# Patient Record
Sex: Female | Born: 1970 | Race: White | Hispanic: No | Marital: Married | Smoking: Never smoker
Health system: Southern US, Community
[De-identification: ages and names within clinical notes are randomized; demographics above are authoritative.]

## PROBLEM LIST (undated history)

## (undated) DIAGNOSIS — R0602 Shortness of breath: Secondary | ICD-10-CM

## (undated) DIAGNOSIS — K259 Gastric ulcer, unspecified as acute or chronic, without hemorrhage or perforation: Secondary | ICD-10-CM

## (undated) DIAGNOSIS — I10 Essential (primary) hypertension: Secondary | ICD-10-CM

## (undated) DIAGNOSIS — M7660 Achilles tendinitis, unspecified leg: Secondary | ICD-10-CM

## (undated) DIAGNOSIS — K9041 Non-celiac gluten sensitivity: Secondary | ICD-10-CM

## (undated) DIAGNOSIS — IMO0002 Reserved for concepts with insufficient information to code with codable children: Secondary | ICD-10-CM

## (undated) DIAGNOSIS — K219 Gastro-esophageal reflux disease without esophagitis: Secondary | ICD-10-CM

## (undated) DIAGNOSIS — F419 Anxiety disorder, unspecified: Secondary | ICD-10-CM

## (undated) DIAGNOSIS — E785 Hyperlipidemia, unspecified: Secondary | ICD-10-CM

## (undated) DIAGNOSIS — J45909 Unspecified asthma, uncomplicated: Secondary | ICD-10-CM

## (undated) DIAGNOSIS — K760 Fatty (change of) liver, not elsewhere classified: Secondary | ICD-10-CM

## (undated) DIAGNOSIS — J309 Allergic rhinitis, unspecified: Secondary | ICD-10-CM

## (undated) DIAGNOSIS — R5383 Other fatigue: Secondary | ICD-10-CM

## (undated) DIAGNOSIS — F32A Depression, unspecified: Secondary | ICD-10-CM

## (undated) DIAGNOSIS — T7840XA Allergy, unspecified, initial encounter: Secondary | ICD-10-CM

## (undated) DIAGNOSIS — M722 Plantar fascial fibromatosis: Secondary | ICD-10-CM

## (undated) DIAGNOSIS — Z91018 Allergy to other foods: Secondary | ICD-10-CM

## (undated) HISTORY — DX: Gastro-esophageal reflux disease without esophagitis: K21.9

## (undated) HISTORY — DX: Hyperlipidemia, unspecified: E78.5

## (undated) HISTORY — DX: Allergy, unspecified, initial encounter: T78.40XA

## (undated) HISTORY — DX: Unspecified asthma, uncomplicated: J45.909

## (undated) HISTORY — DX: Non-celiac gluten sensitivity: K90.41

## (undated) HISTORY — DX: Allergy to other foods: Z91.018

## (undated) HISTORY — DX: Plantar fascial fibromatosis: M72.2

## (undated) HISTORY — PX: PLANTAR FASCIA RELEASE: SHX2239

## (undated) HISTORY — DX: Reserved for concepts with insufficient information to code with codable children: IMO0002

## (undated) HISTORY — DX: Fatty (change of) liver, not elsewhere classified: K76.0

## (undated) HISTORY — DX: Anxiety disorder, unspecified: F41.9

## (undated) HISTORY — DX: Essential (primary) hypertension: I10

## (undated) HISTORY — DX: Allergic rhinitis, unspecified: J30.9

## (undated) HISTORY — PX: CARPAL TUNNEL RELEASE: SHX101

## (undated) HISTORY — PX: TUBAL LIGATION: SHX77

## (undated) HISTORY — DX: Gastric ulcer, unspecified as acute or chronic, without hemorrhage or perforation: K25.9

## (undated) HISTORY — DX: Achilles tendinitis, unspecified leg: M76.60

## (undated) HISTORY — DX: Depression, unspecified: F32.A

## (undated) HISTORY — DX: Other fatigue: R53.83

## (undated) HISTORY — DX: Shortness of breath: R06.02

---

## 1998-01-16 ENCOUNTER — Encounter: Admission: RE | Admit: 1998-01-16 | Discharge: 1998-01-16 | Payer: Self-pay | Admitting: Infectious Diseases

## 1998-01-30 ENCOUNTER — Encounter: Admission: RE | Admit: 1998-01-30 | Discharge: 1998-01-30 | Payer: Self-pay | Admitting: Infectious Diseases

## 1998-02-06 ENCOUNTER — Encounter: Admission: RE | Admit: 1998-02-06 | Discharge: 1998-02-06 | Payer: Self-pay | Admitting: Infectious Diseases

## 2006-12-17 ENCOUNTER — Ambulatory Visit: Payer: Self-pay | Admitting: Emergency Medicine

## 2010-06-27 HISTORY — PX: OTHER SURGICAL HISTORY: SHX169

## 2011-02-21 DIAGNOSIS — E7800 Pure hypercholesterolemia, unspecified: Secondary | ICD-10-CM | POA: Insufficient documentation

## 2011-02-21 DIAGNOSIS — E669 Obesity, unspecified: Secondary | ICD-10-CM | POA: Insufficient documentation

## 2011-02-21 DIAGNOSIS — F329 Major depressive disorder, single episode, unspecified: Secondary | ICD-10-CM | POA: Insufficient documentation

## 2011-02-21 DIAGNOSIS — G4700 Insomnia, unspecified: Secondary | ICD-10-CM | POA: Insufficient documentation

## 2012-05-07 DIAGNOSIS — L821 Other seborrheic keratosis: Secondary | ICD-10-CM | POA: Insufficient documentation

## 2013-06-07 DIAGNOSIS — N3090 Cystitis, unspecified without hematuria: Secondary | ICD-10-CM | POA: Insufficient documentation

## 2013-11-26 DIAGNOSIS — K589 Irritable bowel syndrome without diarrhea: Secondary | ICD-10-CM | POA: Insufficient documentation

## 2013-11-26 DIAGNOSIS — Z9889 Other specified postprocedural states: Secondary | ICD-10-CM | POA: Insufficient documentation

## 2015-10-07 ENCOUNTER — Other Ambulatory Visit: Payer: Self-pay | Admitting: Family Medicine

## 2015-10-07 DIAGNOSIS — K7460 Unspecified cirrhosis of liver: Secondary | ICD-10-CM

## 2015-10-26 ENCOUNTER — Ambulatory Visit
Admission: RE | Admit: 2015-10-26 | Discharge: 2015-10-26 | Disposition: A | Discharge status: Home / Self Care | Payer: BLUE CROSS/BLUE SHIELD | Source: Ambulatory Visit | Attending: Family Medicine | Admitting: Family Medicine

## 2015-10-26 DIAGNOSIS — E669 Obesity, unspecified: Secondary | ICD-10-CM | POA: Insufficient documentation

## 2015-10-26 DIAGNOSIS — K7460 Unspecified cirrhosis of liver: Secondary | ICD-10-CM

## 2015-10-26 DIAGNOSIS — Z8489 Family history of other specified conditions: Secondary | ICD-10-CM | POA: Insufficient documentation

## 2015-10-26 DIAGNOSIS — Z6841 Body Mass Index (BMI) 40.0 and over, adult: Secondary | ICD-10-CM | POA: Diagnosis present

## 2015-10-26 DIAGNOSIS — R932 Abnormal findings on diagnostic imaging of liver and biliary tract: Secondary | ICD-10-CM | POA: Insufficient documentation

## 2017-05-26 IMAGING — US US ABDOMEN LIMITED
1 series · 14 of 25 positions shown · non-contrast
Comparison: None.

CLINICAL DATA: Cirrhosis without ascites.

EXAM:
US ABDOMEN LIMITED - RIGHT UPPER QUADRANT

[Series 1: us abdomen limited · 0.28mm/px · 14 of 45 slices shown]
[im 1/45]
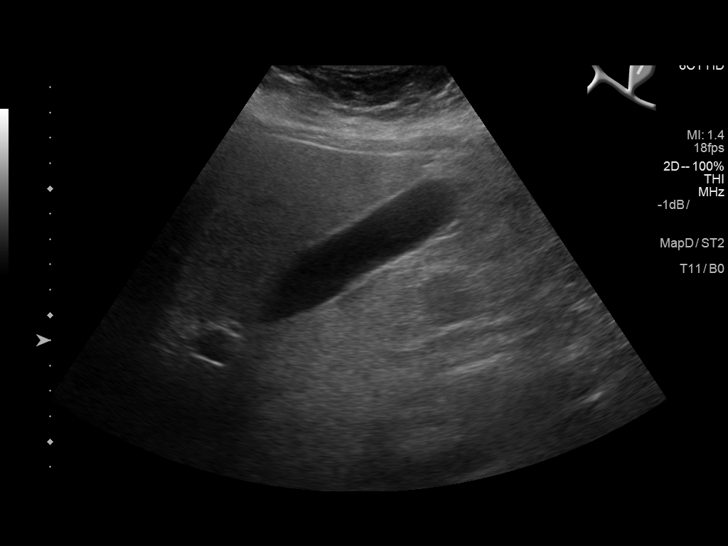
[im 4/45]
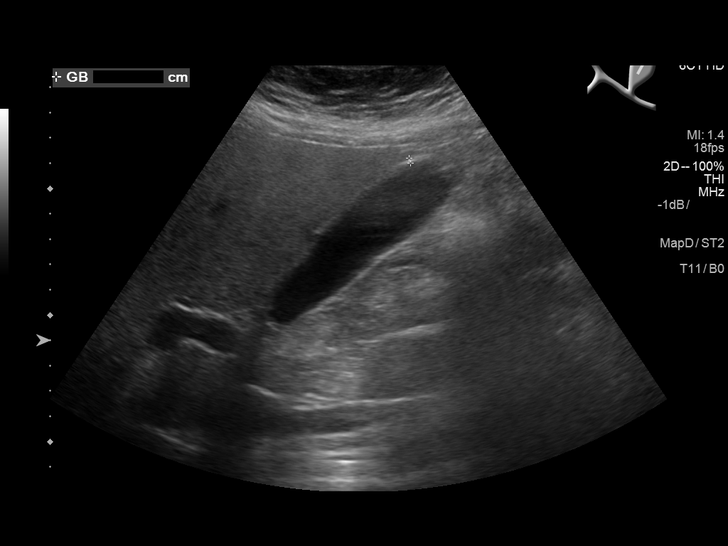
[im 8/45]
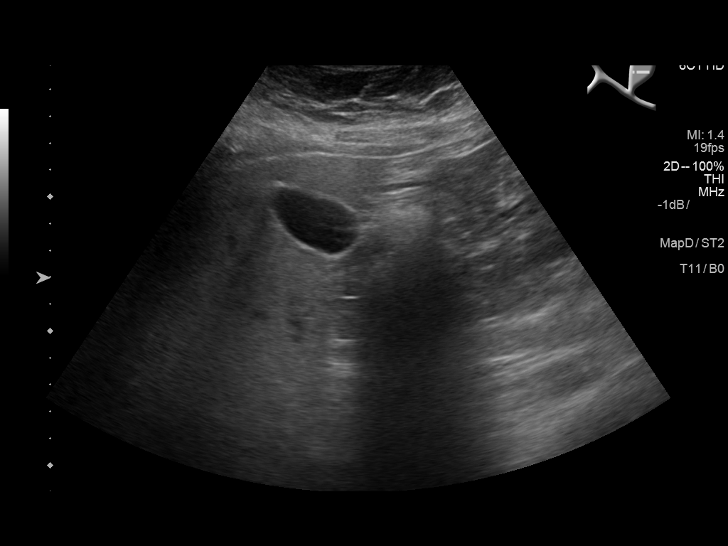
[im 12/45]
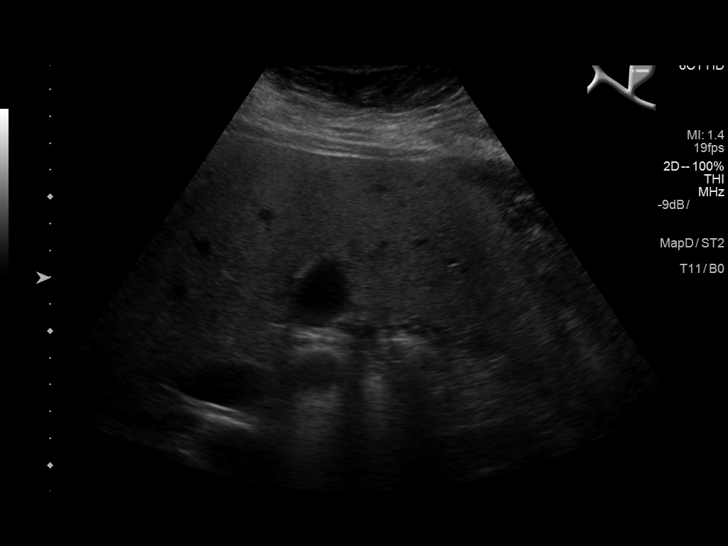
[im 15/45]
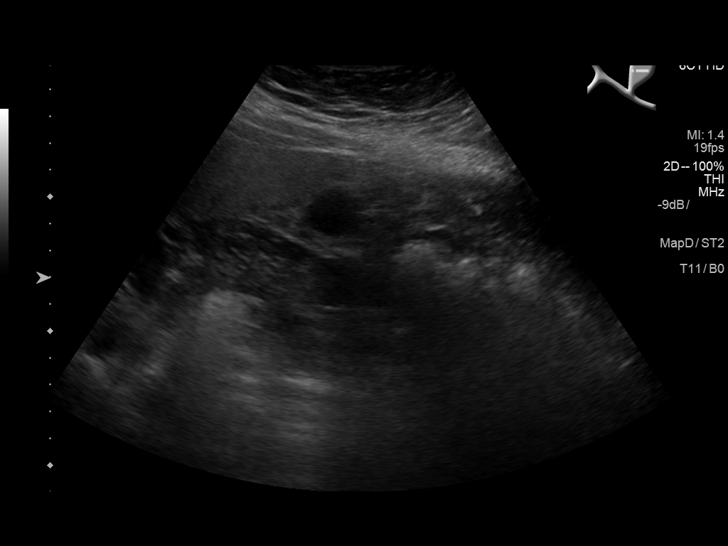
[im 17/45]
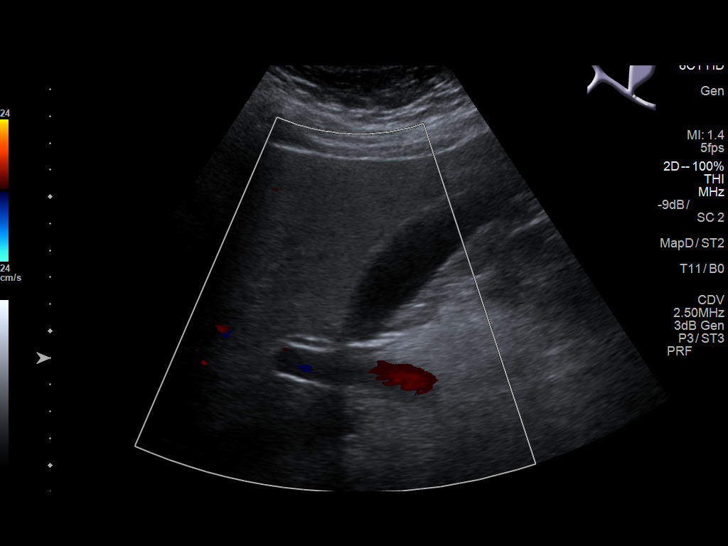
[im 21/45]
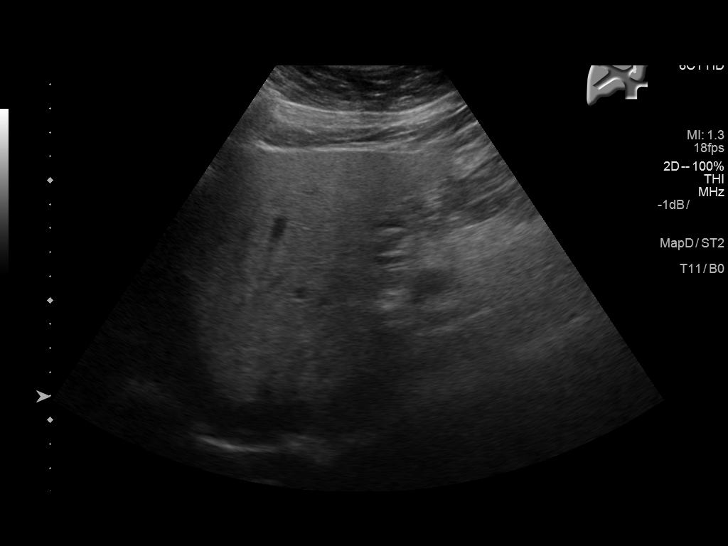
[im 24/45]
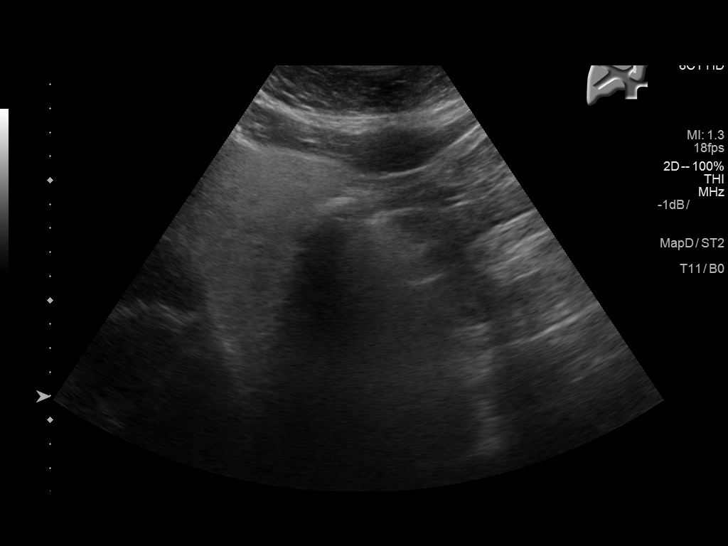
[im 28/45]
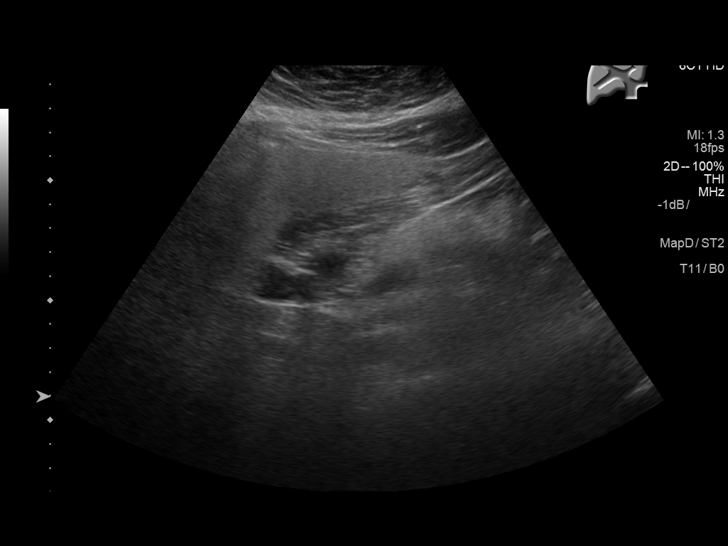
[im 30/45]
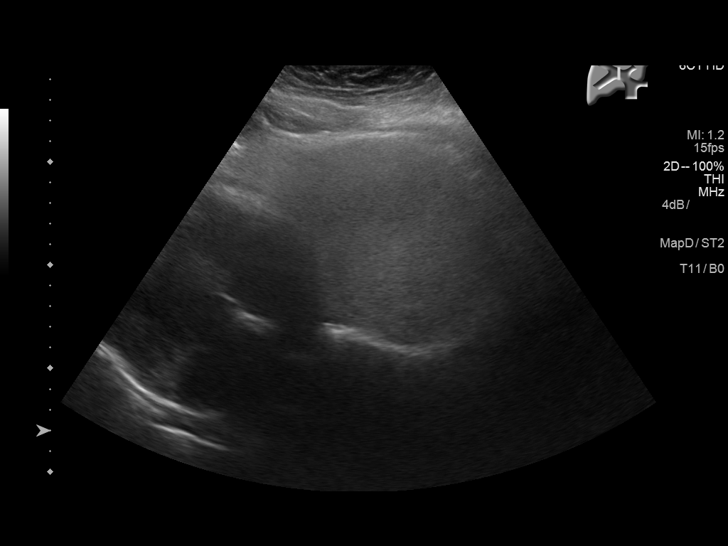
[im 34/45]
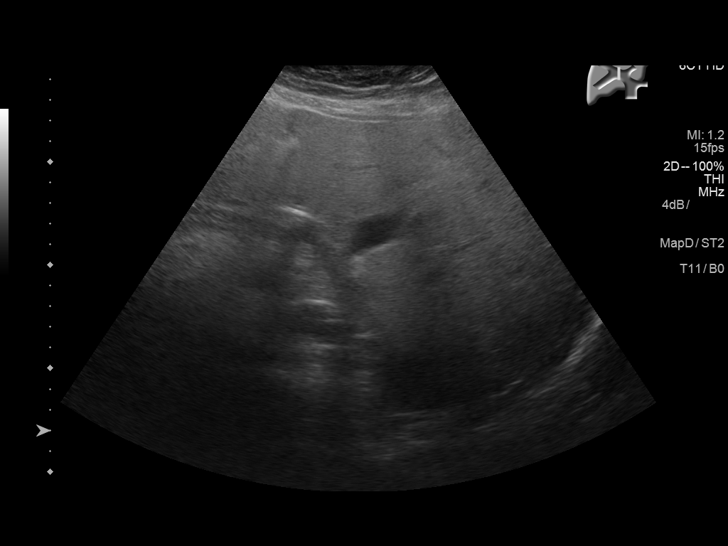
[im 37/45]
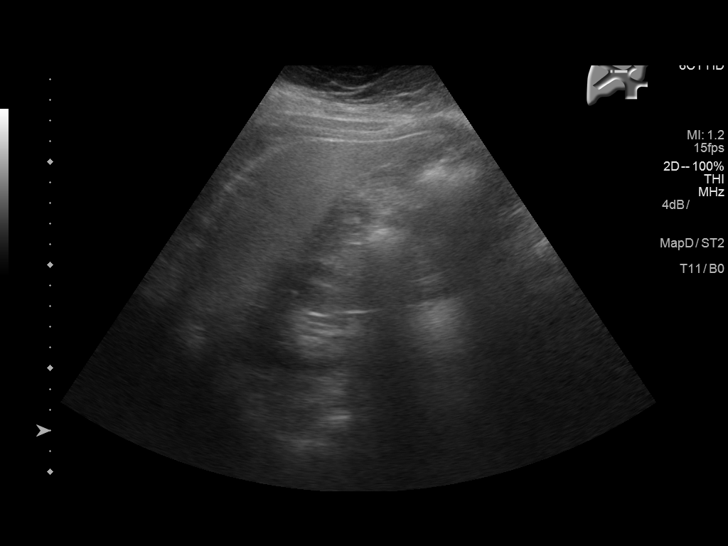
[im 41/45]
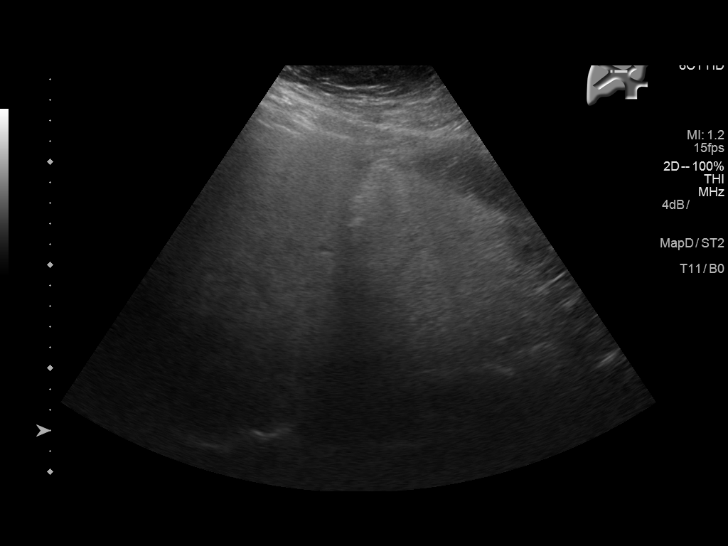
[im 45/45]
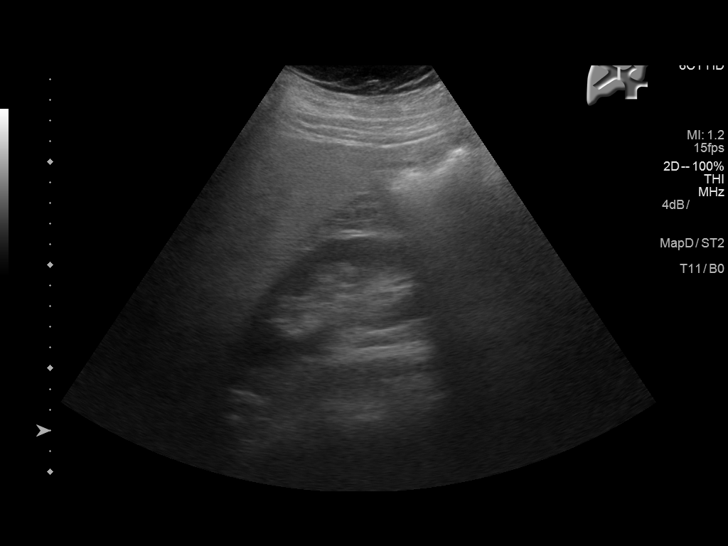

[14 of 25 positions shown; findings below may reference images not displayed]

FINDINGS: Gallbladder:

No gallstones or wall thickening visualized. No sonographic Murphy
sign noted by sonographer.

Common bile duct:

Diameter: Normal at 3 mm

Liver:

Liver is uniformly increased in echogenicity. No focal lesion
identified. No biliary duct dilatation.
IMPRESSION: Echogenic liver consistent given history of cirrhosis. No focal
lesion identified by ultrasound.

## 2018-01-24 DIAGNOSIS — J4550 Severe persistent asthma, uncomplicated: Secondary | ICD-10-CM | POA: Insufficient documentation

## 2018-01-24 DIAGNOSIS — F411 Generalized anxiety disorder: Secondary | ICD-10-CM | POA: Insufficient documentation

## 2018-01-24 DIAGNOSIS — J45901 Unspecified asthma with (acute) exacerbation: Secondary | ICD-10-CM | POA: Insufficient documentation

## 2018-05-09 DIAGNOSIS — R0602 Shortness of breath: Secondary | ICD-10-CM | POA: Insufficient documentation

## 2018-07-03 ENCOUNTER — Ambulatory Visit (INDEPENDENT_AMBULATORY_CARE_PROVIDER_SITE_OTHER): Payer: BLUE CROSS/BLUE SHIELD | Admitting: Allergy and Immunology

## 2018-07-03 ENCOUNTER — Encounter: Payer: Self-pay | Admitting: Allergy and Immunology

## 2018-07-03 VITALS — BP 136/82 | HR 84 | Temp 98.4°F | Resp 20 | Ht 66.0 in | Wt 249.0 lb

## 2018-07-03 DIAGNOSIS — J4540 Moderate persistent asthma, uncomplicated: Secondary | ICD-10-CM | POA: Diagnosis not present

## 2018-07-03 DIAGNOSIS — J3089 Other allergic rhinitis: Secondary | ICD-10-CM | POA: Diagnosis not present

## 2018-07-03 NOTE — Progress Notes (Signed)
Dear Dr. Arlis Porta,  Thank you for referring Shelia Bowers to the [facility] [place] on 07/03/2018.   Below is a summation of this patient's evaluation and recommendations.  Thank you for your referral. I will keep you informed about this patient's response to treatment.   If you have any questions please do not hesitate to contact me.   Sincerely,  Jessica Priest, MD Allergy / Immunology [facility]   ______________________________________________________________________    NEW PATIENT NOTE  Referring Provider: Jerrol Banana, MD Primary Provider: Dortha Kern, MD Date of office visit: 07/03/2018    Subjective:   Chief Complaint:  Shelia Bowers (DOB: [birth year]) is a 49 y.o. female who presents to the clinic on 07/03/2018 with a chief complaint of New Patient (Initial Visit) (transferring care from [facility] has moved here she is on itx) .     HPI: Shelia Bowers presents to this clinic in evaluation of asthma and allergic rhinitis.  She has a long history of issues with asthma and allergic rhinitis and has been receiving immunotherapy directed at dust mite, cat, dog, and pollens, from [facility] [place] with Dr. Arlis Porta over the course of the past 3 years.  Her job has changed and she is now commuting to the [place] area and would like to transfer her immunotherapy to this clinic.  Immunotherapy has resulted in rather dramatic improvement regarding all of her airway issues.  She has not required a systemic steroid in over a year to treat any type of airway issue while continuing to use Advair on a regular basis.  There was an attempt to taper her off Advair and replace this medication with Flovent in the fall 2018 but apparently she failed that medication tapering.  She rarely uses a short acting bronchodilator.  She did have a very slight flareup of her asthma during an  upper respiratory tract infection in September 2019 that required her to use a short acting bronchodilator for a few days.  But otherwise she has had excellent control of all of her airway issue.  Currently immunotherapy is administered every 4 weeks.  She has been receiving this form of treatment without any adverse effect.  She did receive the flu vaccine this year.  Past Medical History:  Diagnosis Date  . Asthma     Past Surgical History:  Procedure Laterality Date  . CARPAL TUNNEL RELEASE Bilateral   . TUBAL LIGATION      Allergies as of 07/03/2018      Reactions   Azithromycin Hives   Clarithromycin Hives   Macrolides And Ketolides Hives   Medroxyprogesterone Other (See Comments)   Mono like sx, fatigue, nausea, vomiting Mono like sx, fatigue, nausea, vomiting Mono like sx, fatigue, nausea, vomiting Mono like sx, fatigue, nausea, vomiting   Rizatriptan Palpitations      Medication List      albuterol 108 (90 Base) MCG/ACT inhaler Commonly known as:  PROVENTIL HFA;VENTOLIN HFA Inhale into the lungs.   Fluticasone-Salmeterol 100-50 MCG/DOSE Aepb Commonly known as:  ADVAIR   multivitamin capsule Take 1 capsule by mouth daily.   PRILOSEC OTC 20 MG tablet Generic drug:  omeprazole Take by mouth.   SAXENDA 18 MG/3ML Sopn Generic drug:  Liraglutide -Weight Management INJECT 3 MG SQ QD   sertraline 50 MG tablet Commonly known as:  ZOLOFT take 1.5 tablet oral daily  Review of systems negative except as noted in HPI / PMHx or noted below:  Review of Systems  Constitutional: Negative.   HENT: Negative.   Eyes: Negative.   Respiratory: Negative.   Cardiovascular: Negative.   Gastrointestinal: Positive for heartburn (Reflux treated with omeprazole daily).  Genitourinary: Negative.   Musculoskeletal: Negative.   Skin: Negative.   Neurological: Negative.   Endo/Heme/Allergies: Negative.   Psychiatric/Behavioral: Negative.     Family History    Problem Relation Age of Onset  . Allergic rhinitis Mother   . Asthma Mother     Social History   Socioeconomic History  . Marital status: Married    Spouse name: Not on file  . Number of children: Not on file  . Years of education: Not on file  . Highest education level: Not on file  Occupational History  . Not on file  Social Needs  . Financial resource strain: Not on file  . Food insecurity:    Worry: Not on file    Inability: Not on file  . Transportation needs:    Medical: Not on file    Non-medical: Not on file  Tobacco Use  . Smoking status: Never Smoker  . Smokeless tobacco: Never Used  Substance and Sexual Activity  . Alcohol use: Never    Frequency: Never  . Drug use: Never  . Sexual activity: Not on file  Lifestyle  . Physical activity:    Days per week: Not on file    Minutes per session: Not on file  . Stress: Not on file  Relationships  . Social connections:    Talks on phone: Not on file    Gets together: Not on file    Attends religious service: Not on file    Active member of club or organization: Not on file    Attends meetings of clubs or organizations: Not on file    Relationship status: Not on file  . Intimate partner violence:    Fear of current or ex partner: Not on file    Emotionally abused: Not on file    Physically abused: Not on file    Forced sexual activity: Not on file  Other Topics Concern  . Not on file  Social History Narrative  . Not on file    Environmental and Social history  Lives in a house with a dry environment, a dog located inside the household, no carpet in the bedroom, plastic on the bed, plastic on the pillow, no smokers located to the household.  She works in an office setting.  Objective:   Vitals:   07/03/18 1339  BP: 136/82  Pulse: 84  Resp: 20  Temp: 98.4 F (36.9 C)  SpO2: 97%   Height: 5\' 6"  (167.6 cm) Weight: 249 lb (112.9 kg)  Physical Exam Constitutional:      Appearance: She is not  diaphoretic.  HENT:     Head: Normocephalic.     Right Ear: Tympanic membrane, ear canal and external ear normal.     Left Ear: Tympanic membrane, ear canal and external ear normal.     Nose: Nose normal. No mucosal edema or rhinorrhea.     Mouth/Throat:     Pharynx: Uvula midline. No oropharyngeal exudate.  Eyes:     Conjunctiva/sclera: Conjunctivae normal.  Neck:     Thyroid: No thyromegaly.     Trachea: Trachea normal. No tracheal tenderness or tracheal deviation.  Cardiovascular:     Rate and Rhythm: Normal rate  and regular rhythm.     Heart sounds: Normal heart sounds, S1 normal and S2 normal. No murmur.  Pulmonary:     Effort: No respiratory distress.     Breath sounds: Normal breath sounds. No stridor. No wheezing or rales.  Lymphadenopathy:     Head:     Right side of head: No tonsillar adenopathy.     Left side of head: No tonsillar adenopathy.     Cervical: No cervical adenopathy.  Skin:    Findings: No erythema or rash.     Nails: There is no clubbing.   Neurological:     Mental Status: She is alert.     Diagnostics: Allergy skin tests were not performed.   Spirometry was performed and demonstrated an FEV1 of 3.07 @ 100 % of predicted. FEV1/FVC = 0.9   Assessment and Plan:    1. Asthma, moderate persistent, well-controlled   2. Other allergic rhinitis     1.  Continue Advair 100 -1 inhalation twice a day  2.  Continue Ventolin HFA 2 and elations every 4-6 hours if needed  3.  Continue OTC Flonase 1-2 sprays each nostril daily during periods of upper airway symptoms  4.  Continue OTC antihistamine if needed  5.  Continue immunotherapy and injectable epinephrine device.  Continue to utilize allergen extract from [facility]  6.  Return to clinic in 1 year or earlier if problem  Shelia Bowers will continue on immunotherapy with extract formulated by [facility] as she has had an excellent response to this form of therapy over the course of the past  several years.  I recommended that she aim for a full 5 years of immunotherapy and then consider discontinuing this form of treatment.  We will be available in this clinic should she develop significant problems in the future.  She will remain on anti-inflammatory medications for her airway as noted above.  She needs to return to this clinic 1 time per year while continuing to receive immunotherapy in this location.  Jessica PriestEric J. Rikki Smestad, MD Allergy / Immunology [facility] [place]

## 2018-07-03 NOTE — Patient Instructions (Addendum)
  1.  Continue Advair 100 -1 inhalation twice a day  2.  Continue Ventolin HFA 2 and elations every 4-6 hours if needed  3.  Continue OTC Flonase 1-2 sprays each nostril daily during periods of upper airway symptoms  4.  Continue OTC antihistamine if needed  5.  Continue immunotherapy and injectable epinephrine device.  Continue to utilize allergen extract from [facility]  6.  Return to clinic in 1 year or earlier if problem

## 2018-07-04 ENCOUNTER — Encounter: Payer: Self-pay | Admitting: Allergy and Immunology

## 2018-07-31 ENCOUNTER — Ambulatory Visit (INDEPENDENT_AMBULATORY_CARE_PROVIDER_SITE_OTHER): Payer: BLUE CROSS/BLUE SHIELD

## 2018-07-31 DIAGNOSIS — J3089 Other allergic rhinitis: Secondary | ICD-10-CM | POA: Diagnosis not present

## 2018-08-30 ENCOUNTER — Ambulatory Visit (INDEPENDENT_AMBULATORY_CARE_PROVIDER_SITE_OTHER): Payer: BLUE CROSS/BLUE SHIELD | Admitting: *Deleted

## 2018-08-30 DIAGNOSIS — J309 Allergic rhinitis, unspecified: Secondary | ICD-10-CM

## 2018-09-27 ENCOUNTER — Ambulatory Visit (INDEPENDENT_AMBULATORY_CARE_PROVIDER_SITE_OTHER): Payer: BLUE CROSS/BLUE SHIELD | Admitting: *Deleted

## 2018-09-27 DIAGNOSIS — J309 Allergic rhinitis, unspecified: Secondary | ICD-10-CM | POA: Diagnosis not present

## 2018-11-01 ENCOUNTER — Ambulatory Visit (INDEPENDENT_AMBULATORY_CARE_PROVIDER_SITE_OTHER): Payer: BLUE CROSS/BLUE SHIELD

## 2018-11-01 DIAGNOSIS — J309 Allergic rhinitis, unspecified: Secondary | ICD-10-CM

## 2018-11-15 ENCOUNTER — Ambulatory Visit (INDEPENDENT_AMBULATORY_CARE_PROVIDER_SITE_OTHER): Payer: BLUE CROSS/BLUE SHIELD | Admitting: *Deleted

## 2018-11-15 DIAGNOSIS — J309 Allergic rhinitis, unspecified: Secondary | ICD-10-CM

## 2018-11-29 ENCOUNTER — Ambulatory Visit (INDEPENDENT_AMBULATORY_CARE_PROVIDER_SITE_OTHER): Payer: BLUE CROSS/BLUE SHIELD

## 2018-11-29 DIAGNOSIS — J309 Allergic rhinitis, unspecified: Secondary | ICD-10-CM

## 2018-12-20 ENCOUNTER — Ambulatory Visit (INDEPENDENT_AMBULATORY_CARE_PROVIDER_SITE_OTHER): Payer: BLUE CROSS/BLUE SHIELD

## 2018-12-20 DIAGNOSIS — J309 Allergic rhinitis, unspecified: Secondary | ICD-10-CM | POA: Diagnosis not present

## 2019-01-17 ENCOUNTER — Ambulatory Visit (INDEPENDENT_AMBULATORY_CARE_PROVIDER_SITE_OTHER): Payer: BLUE CROSS/BLUE SHIELD | Admitting: *Deleted

## 2019-01-17 DIAGNOSIS — J309 Allergic rhinitis, unspecified: Secondary | ICD-10-CM | POA: Diagnosis not present

## 2019-02-14 ENCOUNTER — Ambulatory Visit (INDEPENDENT_AMBULATORY_CARE_PROVIDER_SITE_OTHER): Payer: BLUE CROSS/BLUE SHIELD | Admitting: *Deleted

## 2019-02-14 DIAGNOSIS — J309 Allergic rhinitis, unspecified: Secondary | ICD-10-CM

## 2019-03-14 ENCOUNTER — Ambulatory Visit (INDEPENDENT_AMBULATORY_CARE_PROVIDER_SITE_OTHER): Payer: BLUE CROSS/BLUE SHIELD

## 2019-03-14 DIAGNOSIS — J309 Allergic rhinitis, unspecified: Secondary | ICD-10-CM | POA: Diagnosis not present

## 2019-04-10 ENCOUNTER — Ambulatory Visit (INDEPENDENT_AMBULATORY_CARE_PROVIDER_SITE_OTHER): Payer: BLUE CROSS/BLUE SHIELD

## 2019-04-10 DIAGNOSIS — J309 Allergic rhinitis, unspecified: Secondary | ICD-10-CM | POA: Diagnosis not present

## 2019-04-29 ENCOUNTER — Other Ambulatory Visit: Payer: Self-pay

## 2019-04-29 DIAGNOSIS — Z20822 Contact with and (suspected) exposure to covid-19: Secondary | ICD-10-CM

## 2019-04-30 LAB — NOVEL CORONAVIRUS, NAA: SARS-CoV-2, NAA: DETECTED — AB

## 2019-05-14 ENCOUNTER — Telehealth: Payer: Self-pay | Admitting: *Deleted

## 2019-05-14 ENCOUNTER — Encounter: Payer: Self-pay | Admitting: Allergy and Immunology

## 2019-05-14 NOTE — Telephone Encounter (Signed)
Called and left a voicemail for patient to return call.  

## 2019-05-14 NOTE — Telephone Encounter (Signed)
Shelia Bowers, [name]  You 4 hours ago (10:44 AM)   Can you call this patient and ask her when her symptoms subsided? To be completely safe and if her symptoms have been subsided for 14 days- she could resume therapy next week. Let's call her and see when and what symptoms she had.   Thank you    Message text     Alizandra, Loh, MD 4 hours ago (10:31 AM)     Good morning,  I am recovering from Covid (diagnosed on 11/2) and was due for my allergy injections last Thursday (11/12).  When is is safe for me to resume my therapy?

## 2019-05-14 NOTE — Telephone Encounter (Signed)
Pt informed that she may resume injections next week to be on the safe side- pt was okay with this.

## 2019-05-28 ENCOUNTER — Ambulatory Visit (INDEPENDENT_AMBULATORY_CARE_PROVIDER_SITE_OTHER): Payer: BLUE CROSS/BLUE SHIELD | Admitting: *Deleted

## 2019-05-28 ENCOUNTER — Telehealth: Payer: Self-pay | Admitting: *Deleted

## 2019-05-28 DIAGNOSIS — J309 Allergic rhinitis, unspecified: Secondary | ICD-10-CM | POA: Diagnosis not present

## 2019-05-28 NOTE — Telephone Encounter (Signed)
Shot records and signed vial refill request by patient has been faxed to [facility] requesting new allergen vials and new shot records be mailed to our office. Will need to follow up to ensure documents were received.

## 2019-06-03 NOTE — Telephone Encounter (Signed)
Called and spoke with the shot clinic at [facility] and they confirmed that they received the vial re-order request and they are ordering her vials to be made now and they will be mailed to our office with new shot records.

## 2019-06-26 ENCOUNTER — Ambulatory Visit (INDEPENDENT_AMBULATORY_CARE_PROVIDER_SITE_OTHER): Payer: BLUE CROSS/BLUE SHIELD | Admitting: *Deleted

## 2019-06-26 DIAGNOSIS — J309 Allergic rhinitis, unspecified: Secondary | ICD-10-CM | POA: Diagnosis not present

## 2019-07-31 ENCOUNTER — Ambulatory Visit (INDEPENDENT_AMBULATORY_CARE_PROVIDER_SITE_OTHER): Payer: 59

## 2019-07-31 DIAGNOSIS — J309 Allergic rhinitis, unspecified: Secondary | ICD-10-CM

## 2019-08-30 ENCOUNTER — Ambulatory Visit: Payer: Self-pay | Admitting: Family Medicine

## 2019-09-06 ENCOUNTER — Other Ambulatory Visit: Payer: Self-pay

## 2019-09-06 ENCOUNTER — Telehealth: Payer: Self-pay

## 2019-09-06 ENCOUNTER — Encounter: Payer: Self-pay | Admitting: Family Medicine

## 2019-09-06 ENCOUNTER — Ambulatory Visit (INDEPENDENT_AMBULATORY_CARE_PROVIDER_SITE_OTHER): Payer: 59 | Admitting: Family Medicine

## 2019-09-06 VITALS — BP 130/80 | HR 81 | Temp 98.2°F | Ht 65.0 in | Wt 277.0 lb

## 2019-09-06 DIAGNOSIS — J4540 Moderate persistent asthma, uncomplicated: Secondary | ICD-10-CM

## 2019-09-06 DIAGNOSIS — J302 Other seasonal allergic rhinitis: Secondary | ICD-10-CM

## 2019-09-06 DIAGNOSIS — J3089 Other allergic rhinitis: Secondary | ICD-10-CM

## 2019-09-06 DIAGNOSIS — K219 Gastro-esophageal reflux disease without esophagitis: Secondary | ICD-10-CM | POA: Diagnosis not present

## 2019-09-06 MED ORDER — EPINEPHRINE 0.3 MG/0.3ML IJ SOAJ: 0.3000 mg | Freq: Once | INTRAMUSCULAR | 2 refills | Status: AC

## 2019-09-06 NOTE — Patient Instructions (Addendum)
Asthma Continue Advair 100-1 puff every 12 hours to prevent cough or wheeze Continue albuterol 2 puffs every 4 hours as needed for cough or wheeze You may use albuterol 2 puffs 5-15 minutes before activity to decrease cough or wheeze  Allergic rhinitis Continue allergen immunotherapy and have access to an eoinephrine auto-injector set Audry Riles) Continue Flonase 2 sprays in each nostril once a day as needed for a stuffy nose.  In the right nostril, point the applicator out toward the right ear. In the left nostril, point the applicator out toward the left ear Continue cetirizine 10 mg once a day as needed for a runny nose. Zyrtec D can increase your blood pressure so we recommend an antihistamine without the decongestant component for regular use.   Reflux Continue dietary and lifestyle modifications as listed below Continue opeprazole 20 mg once a day as previously prescribed.  Call the clinic if this treatment plan is not working well for you  Follow up in 6 months or sooner if needed.   Lifestyle Changes for Controlling GERD When you have GERD, stomach acid feels as if it's backing up toward your mouth. Whether or not you take medication to control your GERD, your symptoms can often be improved with lifestyle changes.   Raise Your Head  Reflux is more likely to strike when you're lying down flat, because stomach fluid can  flow backward more easily. Raising the head of your bed 4-6 inches can help. To do this:  Slide blocks or books under the legs at the head of your bed. Or, place a wedge under  the mattress. Many foam stores can make a suitable wedge for you. The wedge  should run from your waist to the top of your head.  Don't just prop your head on several pillows. This increases pressure on your  stomach. It can make GERD worse.  Watch Your Eating Habits Certain foods may increase the acid in your stomach or relax the lower esophageal sphincter, making GERD more likely.  It's best to avoid the following:  Coffee, tea, and carbonated drinks (with and without caffeine)  Fatty, fried, or spicy food  Mint, chocolate, onions, and tomatoes  Any other foods that seem to irritate your stomach or cause you pain  Relieve the Pressure  Eat smaller meals, even if you have to eat more often.  Don't lie down right after you eat. Wait a few hours for your stomach to empty.  Avoid tight belts and tight-fitting clothes.  Lose excess weight.  Tobacco and Alcohol  Avoid smoking tobacco and drinking alcohol. They can make GERD symptoms worse.

## 2019-09-06 NOTE — Progress Notes (Signed)
[address] [place] [address] Dept: [phone]  FOLLOW UP NOTE  Patient ID: Shelia Bowers, female    DOB: 01/22/1971  Age: 49 y.o. MRN: [id number] Date of Office Visit: 09/06/2019  Assessment  Chief Complaint: Asthma and Allergic Rhinitis   HPI Shelia Bowers is a 49 year old female who presents to the clinic for a follow up visit. She was last seen in this clinic on 07/03/2018 by Dr. Lucie Leather for evaluation of asthma, allergic rhinitis, and reflux. At today's visit, she reports her asthma has been moderately well controlled with no shortness of breath or cough.  She does report occasional intermittent wheezing when the pollens are thick and she spent a lot of time outside.  She continues Advair 100-1 puff twice a day and has not used albuterol since the end of November.  Of note, she did have Covid in October.  She reports her wrists taste has returned, however, her sense of smell remains blunted.  Allergic rhinitis is reported as well controlled with her worst symptoms occurring in the spring and the fall.  She is currently using Zyrtec daily and Flonase with relief of symptoms.  She continues allergen immunotherapy once every 4 weeks with no large local reactions.  She reports her symptoms of allergic rhinitis have decreased significantly while continuing on allergen immunotherapy.  Reflux is reported as moderately well controlled with heartburn occurring 1-2 times a week.  She continues Prilosec 20 mg once a day.  Her current medications are listed in the chart.   Drug Allergies:  Allergies  Allergen Reactions  . Azithromycin Hives  . Clarithromycin Hives  . Macrolides And Ketolides Hives  . Medroxyprogesterone Other (See Comments)    Mono like sx, fatigue, nausea, vomiting Mono like sx, fatigue, nausea, vomiting Mono like sx, fatigue, nausea, vomiting Mono like sx, fatigue, nausea, vomiting   . Rizatriptan Palpitations    Physical Exam: BP 130/80 (BP Location:  Right Arm, Patient Position: Sitting, Cuff Size: Large)   Pulse 81   Temp 98.2 F (36.8 C) (Temporal)   Ht 5\' 5"  (1.651 m)   Wt 277 lb (125.6 kg)   SpO2 99%   BMI 46.10 kg/m    Physical Exam Vitals reviewed.  Constitutional:      Appearance: Normal appearance.  HENT:     Head: Normocephalic and atraumatic.     Right Ear: Tympanic membrane normal.     Left Ear: Tympanic membrane normal.     Nose:     Comments: Bilateral nares normal.  Pharynx normal.  Ears normal.  Eyes normal.    Mouth/Throat:     Pharynx: Oropharynx is clear.  Eyes:     Conjunctiva/sclera: Conjunctivae normal.  Cardiovascular:     Rate and Rhythm: Normal rate and regular rhythm.     Heart sounds: Normal heart sounds. No murmur.  Pulmonary:     Effort: Pulmonary effort is normal.     Breath sounds: Normal breath sounds.     Comments: Lungs clear to auscultation Musculoskeletal:        General: Normal range of motion.     Cervical back: Normal range of motion and neck supple.  Skin:    General: Skin is warm and dry.  Neurological:     Mental Status: She is alert and oriented to person, place, and time.  Psychiatric:        Mood and Affect: Mood normal.        Behavior: Behavior normal.  Thought Content: Thought content normal.        Judgment: Judgment normal.     Diagnostics: FVC 3.36, FEV1 2.89.  Predicted FVC 3.68, predicted FEV1 2.92.  Spirometry indicates normal ventilatory function.  Assessment and Plan: 1. Asthma, moderate persistent, well-controlled   2. Seasonal and perennial allergic rhinitis   3. Gastroesophageal reflux disease, unspecified whether esophagitis present     Meds ordered this encounter  Medications  . EPINEPHrine (AUVI-Q) 0.3 mg/0.3 mL IJ SOAJ injection    Sig: Inject 0.3 mLs (0.3 mg total) into the muscle once for 1 dose. As directed for life-threatening allergic reactions    Dispense:  2 each    Refill:  2    [phone]    Patient Instructions   Asthma Continue Advair 100-1 puff every 12 hours to prevent cough or wheeze Continue albuterol 2 puffs every 4 hours as needed for cough or wheeze You may use albuterol 2 puffs 5-15 minutes before activity to decrease cough or wheeze  Allergic rhinitis Continue allergen immunotherapy and have access to an eoinephrine auto-injector set Wynona Luna) Continue Flonase 2 sprays in each nostril once a day as needed for a stuffy nose.  In the right nostril, point the applicator out toward the right ear. In the left nostril, point the applicator out toward the left ear Continue cetirizine 10 mg once a day as needed for a runny nose. Zyrtec D can increase your blood pressure so we recommend an antihistamine without the decongestant component for regular use.   Reflux Continue dietary and lifestyle modifications as listed below Continue opeprazole 20 mg once a day as previously prescribed.  Call the clinic if this treatment plan is not working well for you  Follow up in 6 months or sooner if needed.   Return in about 6 months (around 03/08/2020), or if symptoms worsen or fail to improve.    Thank you for the opportunity to care for this patient.  Please do not hesitate to contact me with questions.  Gareth Morgan, FNP [facility]

## 2019-09-06 NOTE — Telephone Encounter (Signed)
Patient is over 35 days late for her injection. She last received 0.2cc on 07-31-19. I called [facility] in [place] and the office is closed on Fridays. We need to reach out on Monday and get instructions on how to proceed with next dosing.

## 2019-09-09 NOTE — Telephone Encounter (Signed)
Per Eunice Blase at [facility] patient is okay to receive repeat dose of 0.2cc for her injection. Adding note in flowsheet.

## 2019-09-09 NOTE — Telephone Encounter (Signed)
Patient informed of this information.

## 2019-09-24 ENCOUNTER — Ambulatory Visit (INDEPENDENT_AMBULATORY_CARE_PROVIDER_SITE_OTHER): Payer: 59

## 2019-09-24 DIAGNOSIS — J309 Allergic rhinitis, unspecified: Secondary | ICD-10-CM | POA: Diagnosis not present

## 2019-10-31 ENCOUNTER — Ambulatory Visit (INDEPENDENT_AMBULATORY_CARE_PROVIDER_SITE_OTHER): Payer: 59

## 2019-10-31 DIAGNOSIS — J309 Allergic rhinitis, unspecified: Secondary | ICD-10-CM | POA: Diagnosis not present

## 2019-11-28 ENCOUNTER — Ambulatory Visit (INDEPENDENT_AMBULATORY_CARE_PROVIDER_SITE_OTHER): Payer: 59

## 2019-11-28 DIAGNOSIS — J309 Allergic rhinitis, unspecified: Secondary | ICD-10-CM | POA: Diagnosis not present

## 2019-12-26 ENCOUNTER — Ambulatory Visit (INDEPENDENT_AMBULATORY_CARE_PROVIDER_SITE_OTHER): Payer: 59

## 2019-12-26 DIAGNOSIS — J309 Allergic rhinitis, unspecified: Secondary | ICD-10-CM | POA: Diagnosis not present

## 2020-01-21 ENCOUNTER — Ambulatory Visit (INDEPENDENT_AMBULATORY_CARE_PROVIDER_SITE_OTHER): Payer: 59

## 2020-01-21 DIAGNOSIS — J309 Allergic rhinitis, unspecified: Secondary | ICD-10-CM

## 2020-02-20 ENCOUNTER — Ambulatory Visit (INDEPENDENT_AMBULATORY_CARE_PROVIDER_SITE_OTHER): Payer: 59 | Admitting: *Deleted

## 2020-02-20 DIAGNOSIS — J309 Allergic rhinitis, unspecified: Secondary | ICD-10-CM

## 2020-03-10 ENCOUNTER — Encounter: Payer: Self-pay | Admitting: Allergy and Immunology

## 2020-03-10 ENCOUNTER — Ambulatory Visit (INDEPENDENT_AMBULATORY_CARE_PROVIDER_SITE_OTHER): Payer: 59 | Admitting: Allergy and Immunology

## 2020-03-10 ENCOUNTER — Telehealth: Payer: Self-pay | Admitting: *Deleted

## 2020-03-10 ENCOUNTER — Other Ambulatory Visit: Payer: Self-pay

## 2020-03-10 VITALS — BP 110/62 | HR 68 | Temp 98.1°F | Resp 16

## 2020-03-10 DIAGNOSIS — J4540 Moderate persistent asthma, uncomplicated: Secondary | ICD-10-CM

## 2020-03-10 DIAGNOSIS — J309 Allergic rhinitis, unspecified: Secondary | ICD-10-CM

## 2020-03-10 DIAGNOSIS — J3089 Other allergic rhinitis: Secondary | ICD-10-CM

## 2020-03-10 DIAGNOSIS — J302 Other seasonal allergic rhinitis: Secondary | ICD-10-CM

## 2020-03-10 NOTE — Progress Notes (Signed)
[place] - [place] - [place]   Follow-up Note  Referring Provider: Dortha Kern, MD Primary Provider: Dortha Kern, MD Date of Office Visit: 03/10/2020  Subjective:   Shelia Bowers (DOB: June 05, 1971) is a 49 y.o. female who [facility] in re-evaluation of the following:  HPI: Shelia Bowers returns to this clinic in evaluation of allergic rhinoconjunctivitis and asthma.  I have not seen her in this clinic since January 2020 and she did visit with our nurse practitioner in early spring 2021.  She continues to receive immunotherapy utilizing allergy extract from [facility] with a frequency of every 4 weeks without any adverse effect.  This form of therapy has really resulted in very good control of her atopic respiratory disease and she no longer develops a very significant fall time exacerbation of this condition.  Her asthma is under excellent control while using Advair just 1 time per day.  She has not required a systemic steroid in the past year to treat any type of asthma activity and she rarely uses a short acting bronchodilator.  Her nose is doing very well while intermittently and rarely using some nasal steroid.  She has not required the use of an antibiotic to treat any type of upper airway issue.  She contracted Covid in October 2020 manifested as upper respiratory tract infection and GI symptomatology and has received 2 Covid vaccinations.  Allergies as of 03/10/2020      Reactions   Azithromycin Hives   Clarithromycin Hives   Macrolides And Ketolides Hives   Medroxyprogesterone Other (See Comments)   Mono like sx, fatigue, nausea, vomiting Mono like sx, fatigue, nausea, vomiting Mono like sx, fatigue, nausea, vomiting Mono like sx, fatigue, nausea, vomiting   Rizatriptan Palpitations      Medication List      albuterol 108 (90 Base) MCG/ACT inhaler Commonly known as: VENTOLIN HFA Inhale into  the lungs.   Fluticasone-Salmeterol 100-50 MCG/DOSE Aepb Commonly known as: ADVAIR   multivitamin capsule Take 1 capsule by mouth daily.   PriLOSEC OTC 20 MG tablet Generic drug: omeprazole Take by mouth.   Saxenda 18 MG/3ML Sopn Generic drug: Liraglutide -Weight Management INJECT 3 MG SQ QD   sertraline 50 MG tablet Commonly known as: ZOLOFT take 1.5 tablet oral daily       Past Medical History:  Diagnosis Date  . Asthma     Past Surgical History:  Procedure Laterality Date  . CARPAL TUNNEL RELEASE Bilateral   . TUBAL LIGATION      Review of systems negative except as noted in HPI / PMHx or noted below:  Review of Systems  Constitutional: Negative.   HENT: Negative.   Eyes: Negative.   Respiratory: Negative.   Cardiovascular: Negative.   Gastrointestinal: Negative.   Genitourinary: Negative.   Musculoskeletal: Negative.   Skin: Negative.   Neurological: Negative.   Endo/Heme/Allergies: Negative.   Psychiatric/Behavioral: Negative.      Objective:   Vitals:   03/10/20 1635  BP: 110/62  Pulse: 68  Resp: 16  Temp: 98.1 F (36.7 C)  SpO2: 97%          Physical Exam Constitutional:      Appearance: She is not diaphoretic.  HENT:     Head: Normocephalic.     Right Ear: Tympanic membrane, ear canal and external ear normal.     Left Ear: Tympanic membrane, ear canal and external ear normal.  Nose: Nose normal. No mucosal edema or rhinorrhea.     Mouth/Throat:     Pharynx: Uvula midline. No oropharyngeal exudate.  Eyes:     Conjunctiva/sclera: Conjunctivae normal.  Neck:     Thyroid: No thyromegaly.     Trachea: Trachea normal. No tracheal tenderness or tracheal deviation.  Cardiovascular:     Rate and Rhythm: Normal rate and regular rhythm.     Heart sounds: Normal heart sounds, S1 normal and S2 normal. No murmur heard.   Pulmonary:     Effort: No respiratory distress.     Breath sounds: Normal breath sounds. No stridor. No wheezing  or rales.  Lymphadenopathy:     Head:     Right side of head: No tonsillar adenopathy.     Left side of head: No tonsillar adenopathy.     Cervical: No cervical adenopathy.  Skin:    Findings: No erythema or rash.     Nails: There is no clubbing.  Neurological:     Mental Status: She is alert.     Diagnostics:    Spirometry was performed and demonstrated an FEV1 of 2.94 at 101 % of predicted.  Assessment and Plan:   1. Asthma, moderate persistent, well-controlled   2. Seasonal and perennial allergic rhinitis     1.  Continue Advair 100 -1 inhalation 1-2 times per day depending on disease activity  2.  Continue Ventolin HFA 2 and elations every 4-6 hours if needed  3.  Continue OTC Flonase 1-2 sprays each nostril daily during periods of upper airway symptoms  4.  Continue OTC antihistamine if needed  5.  Continue immunotherapy and injectable epinephrine device.  Continue to utilize allergen extract from [facility]  6.  Return to clinic in 1 year or earlier if problem   7.  Obtain fall flu vaccine  Shelia Bowers is doing very well on her current therapy as noted above.  She has undergone approximately 5 years of immunotherapy I did discuss with her the fact that after 5 years of immunotherapy there is an 80% chance that she will be a long-term responder after discontinuing this form of treatment and a 20% chance that she will relapse after discontinuing this form of therapy and she is presently considering her options.  Assuming she does well I will see her back in his clinic in 1 year or earlier if there is a problem.  Laurette Schimke, MD Allergy / Immunology [facility]

## 2020-03-10 NOTE — Telephone Encounter (Signed)
Refill form for the patient's serum has been signed and faxed along with her current shot records to [facility] to re-order her allergy vials.

## 2020-03-10 NOTE — Patient Instructions (Addendum)
  1.  Continue Advair 100 -1 inhalation 1-2 times per day depending on disease activity  2.  Continue Ventolin HFA 2 and elations every 4-6 hours if needed  3.  Continue OTC Flonase 1-2 sprays each nostril daily during periods of upper airway symptoms  4.  Continue OTC antihistamine if needed  5.  Continue immunotherapy and injectable epinephrine device.  Continue to utilize allergen extract from [facility]  6.  Return to clinic in 1 year or earlier if problem   7.  Obtain fall flu vaccine

## 2020-03-11 ENCOUNTER — Encounter: Payer: Self-pay | Admitting: Allergy and Immunology

## 2020-03-11 MED ORDER — FLUTICASONE PROPIONATE 50 MCG/ACT NA SUSP: NASAL | 11 refills | Status: DC

## 2020-04-08 ENCOUNTER — Ambulatory Visit (INDEPENDENT_AMBULATORY_CARE_PROVIDER_SITE_OTHER): Payer: 59 | Admitting: *Deleted

## 2020-04-08 ENCOUNTER — Telehealth: Payer: Self-pay | Admitting: *Deleted

## 2020-04-08 DIAGNOSIS — J309 Allergic rhinitis, unspecified: Secondary | ICD-10-CM

## 2020-04-08 NOTE — Telephone Encounter (Signed)
Tried calling [facility] [phone] (fax # [phone]) for verification on receiving request fax form for new vials. Refer to tel enc 9/14, it was signed by pt and faxed by Shelton Silvas. I gave pt her allergy injections today and I noticed they were low, she can get 1 (maybe 2) more injections out of them. I left a message with [facility] to return my call.

## 2020-04-09 NOTE — Telephone Encounter (Signed)
Spoke with Rosa at [facility] in the shot room and she said they did have the request for 2 new vials and that it was still in que and there orders are 4-6 weeks behind. She said that her new vials should be there next Tuesday and they are currently only doing shipments 1 day a week now, so it its not next Tuesday then it will be the following week. I informed the pt.

## 2020-05-06 ENCOUNTER — Ambulatory Visit (INDEPENDENT_AMBULATORY_CARE_PROVIDER_SITE_OTHER): Payer: 59 | Admitting: *Deleted

## 2020-05-06 DIAGNOSIS — J309 Allergic rhinitis, unspecified: Secondary | ICD-10-CM | POA: Diagnosis not present

## 2020-06-04 ENCOUNTER — Ambulatory Visit (INDEPENDENT_AMBULATORY_CARE_PROVIDER_SITE_OTHER): Payer: 59 | Admitting: *Deleted

## 2020-06-04 DIAGNOSIS — J309 Allergic rhinitis, unspecified: Secondary | ICD-10-CM | POA: Diagnosis not present

## 2020-06-06 LAB — HM PAP SMEAR: HPV Aptima: NEGATIVE

## 2020-06-30 ENCOUNTER — Ambulatory Visit (INDEPENDENT_AMBULATORY_CARE_PROVIDER_SITE_OTHER): Payer: 59

## 2020-06-30 DIAGNOSIS — J309 Allergic rhinitis, unspecified: Secondary | ICD-10-CM | POA: Diagnosis not present

## 2020-07-06 ENCOUNTER — Other Ambulatory Visit: Payer: 59

## 2020-07-06 DIAGNOSIS — Z20822 Contact with and (suspected) exposure to covid-19: Secondary | ICD-10-CM

## 2020-07-07 LAB — NOVEL CORONAVIRUS, NAA: SARS-CoV-2, NAA: NOT DETECTED

## 2020-07-07 LAB — SARS-COV-2, NAA 2 DAY TAT

## 2020-07-07 LAB — SPECIMEN STATUS REPORT

## 2020-07-08 ENCOUNTER — Ambulatory Visit: Payer: 59 | Admitting: Podiatry

## 2020-07-20 ENCOUNTER — Other Ambulatory Visit: Payer: Self-pay

## 2020-07-20 ENCOUNTER — Ambulatory Visit (INDEPENDENT_AMBULATORY_CARE_PROVIDER_SITE_OTHER): Payer: 59

## 2020-07-20 ENCOUNTER — Ambulatory Visit (INDEPENDENT_AMBULATORY_CARE_PROVIDER_SITE_OTHER): Payer: 59 | Admitting: Podiatry

## 2020-07-20 DIAGNOSIS — M722 Plantar fascial fibromatosis: Secondary | ICD-10-CM

## 2020-07-20 DIAGNOSIS — M216X2 Other acquired deformities of left foot: Secondary | ICD-10-CM

## 2020-07-20 DIAGNOSIS — M216X1 Other acquired deformities of right foot: Secondary | ICD-10-CM | POA: Diagnosis not present

## 2020-07-20 MED ORDER — MELOXICAM 15 MG PO TABS: 15.0000 mg | ORAL_TABLET | Freq: Every day | ORAL | 3 refills | Status: DC

## 2020-07-20 MED ORDER — DEXAMETHASONE SODIUM PHOSPHATE 4 MG/ML IJ SOLN
4.0000 mg | Freq: Once | INTRAMUSCULAR | Status: AC
  Administered 2020-07-20: 4 mg

## 2020-07-20 MED ORDER — TRIAMCINOLONE ACETONIDE 40 MG/ML IJ SUSP
10.0000 mg | Freq: Once | INTRAMUSCULAR | Status: AC
  Administered 2020-07-20: 10 mg

## 2020-07-20 NOTE — Progress Notes (Signed)
  Subjective:  Patient ID: Shelia Bowers, female    DOB: 09/22/1970,  MRN: [id number]  Chief Complaint  Patient presents with  . Plantar Fasciitis    Right and left heel pain. Right heel and foot has sharp, shooting pain. Has tried a night splint, wears supportive shoes and ice the area.     50 y.o. female presents with the above complaint. History confirmed with patient.  Been going on since the summer.  It is worse when walking.  The left foot only hurts when she is walking longer distances and toward the end of the day.  The right is constant.  Objective:  Physical Exam: warm, good capillary refill, no trophic changes or ulcerative lesions, normal DP and PT pulses and normal sensory exam. Left Foot: point tenderness over the heel pad  Right Foot: point tenderness over the heel pad, sharp and more severe than the left side  Radiographs: X-ray of the left foot: Pes cavus foot type with no plantar calcaneal enthesophytes noted Assessment:   1. Plantar fasciitis   2. Acquired bilateral pes cavus      Plan:  Patient was evaluated and treated and all questions answered.   Discussed the etiology and treatment options for plantar fasciitis including stretching, formal physical therapy, supportive shoegears such as a running shoe or sneaker, pre fabricated orthoses, injection therapy, and oral medications. We also discussed the role of surgical treatment of this for patients who do not improve after exhausting non-surgical treatment options.   Plantar Fasciitis -XR reviewed with patient -Educated patient on stretching and icing of the affected limb -Injection delivered to the plantar fascia of the right foot. -Rx for meloxicam. Educated on use, risks and benefits of the medication  -Considering her pes cavus foot type, I think she would likely benefit more from custom molded orthoses.  Should be scheduled correct to be casted for these  After sterile prep with povidone-iodine  solution and alcohol, the right heel was injected with . 1 cc 0.5% marcaine plain, 10 mg triamcinolone acetonide, and 4 mg dexamethasone was injected along  the plantar fascia at the insertion on the plantar calcaneus. The patient tolerated the procedure well without complication.    Return in about 1 month (around 08/20/2020).

## 2020-07-20 NOTE — Patient Instructions (Signed)

## 2020-07-22 ENCOUNTER — Ambulatory Visit: Payer: 59 | Admitting: Podiatry

## 2020-07-31 ENCOUNTER — Ambulatory Visit (INDEPENDENT_AMBULATORY_CARE_PROVIDER_SITE_OTHER): Payer: 59 | Admitting: *Deleted

## 2020-07-31 DIAGNOSIS — J309 Allergic rhinitis, unspecified: Secondary | ICD-10-CM

## 2020-08-12 ENCOUNTER — Other Ambulatory Visit: Payer: Self-pay

## 2020-08-12 ENCOUNTER — Ambulatory Visit (INDEPENDENT_AMBULATORY_CARE_PROVIDER_SITE_OTHER): Payer: 59 | Admitting: Orthotics

## 2020-08-12 DIAGNOSIS — M216X2 Other acquired deformities of left foot: Secondary | ICD-10-CM

## 2020-08-12 DIAGNOSIS — M216X1 Other acquired deformities of right foot: Secondary | ICD-10-CM

## 2020-08-12 DIAGNOSIS — M722 Plantar fascial fibromatosis: Secondary | ICD-10-CM

## 2020-08-12 NOTE — Progress Notes (Signed)
Patient came in today for evaluation/assessment custom foot orthotics.  Patient presents foot pain and discomfort associated with plantar fasciitis.  Patient has noted pes cavus foot type with also rear foot varus deformity. Marland KitchenMarland KitchenMarland KitchenGoal is to "bring ground up" with contoured arch support, and 4 degree valgus RF and FF posting.     Also addressing p/f.

## 2020-08-17 ENCOUNTER — Ambulatory Visit (INDEPENDENT_AMBULATORY_CARE_PROVIDER_SITE_OTHER): Payer: 59 | Admitting: Podiatry

## 2020-08-17 ENCOUNTER — Other Ambulatory Visit: Payer: Self-pay

## 2020-08-17 ENCOUNTER — Encounter: Payer: Self-pay | Admitting: Podiatry

## 2020-08-17 DIAGNOSIS — M216X1 Other acquired deformities of right foot: Secondary | ICD-10-CM | POA: Diagnosis not present

## 2020-08-17 DIAGNOSIS — M722 Plantar fascial fibromatosis: Secondary | ICD-10-CM | POA: Diagnosis not present

## 2020-08-17 DIAGNOSIS — M216X2 Other acquired deformities of left foot: Secondary | ICD-10-CM

## 2020-08-17 NOTE — Progress Notes (Signed)
  Subjective:  Patient ID: Shelia Bowers, female    DOB: Jan 27, 1971,  MRN: [id number]  Chief Complaint  Patient presents with  . Plantar Fasciitis    "the left foot is better, but the right foot is still angry and painful"    50 y.o. female returns with the above complaint. History confirmed with patient.  Injection helped for about a day and then went away.  She was casted for orthotics by her pedorthist  Objective:  Physical Exam: warm, good capillary refill, no trophic changes or ulcerative lesions, normal DP and PT pulses and normal sensory exam. Left Foot: Today she has no point tenderness over the heel pad  Right Foot: point tenderness over the heel pad, sharp and more severe than the left side  Radiographs: X-ray of the left foot: Pes cavus foot type with no plantar calcaneal enthesophytes noted Assessment:   No diagnosis found.   Plan:  Patient was evaluated and treated and all questions answered.   Discussed the etiology and treatment options for plantar fasciitis including stretching, formal physical therapy, supportive shoegears such as a running shoe or sneaker, pre fabricated orthoses, injection therapy, and oral medications. We also discussed the role of surgical treatment of this for patients who do not improve after exhausting non-surgical treatment options.  -Continue meloxicam -Continue twice daily stretching -I think should benefit from physical therapy at this point.  Referral for [facility] physical therapy was given to her -Too soon for injection.  I think it would be reasonable to retry another one at next visit if she is not improving -I would like to increase her immobilization with a CAM boot short.  Think this would alleviate her pain and give her some rest.  Expect to be in the boot for 3 to 4 weeks -At next visit if not improving or is worse consider MRI   Return in about 1 month (around 09/14/2020) for recheck plantar fasciitis.

## 2020-08-17 NOTE — Patient Instructions (Signed)
Continue the stretching and using the meloxicam daily  Wear the boot for longer amounts of standing and walking. Do not drive in the boot   If you notice back/hip pain from wearing the boot:  Look for an "EvenUp" shoe attachment on [employer] or at [employer]. This will level out your hips while you are walking in the CAM boot. Wear this on the other foot around a supportive sneaker:

## 2020-08-25 ENCOUNTER — Ambulatory Visit (INDEPENDENT_AMBULATORY_CARE_PROVIDER_SITE_OTHER): Payer: 59

## 2020-08-25 DIAGNOSIS — J309 Allergic rhinitis, unspecified: Secondary | ICD-10-CM

## 2020-08-26 ENCOUNTER — Ambulatory Visit: Payer: 59 | Admitting: Podiatry

## 2020-09-09 ENCOUNTER — Ambulatory Visit (INDEPENDENT_AMBULATORY_CARE_PROVIDER_SITE_OTHER): Payer: 59 | Admitting: Podiatry

## 2020-09-09 ENCOUNTER — Other Ambulatory Visit: Payer: Self-pay

## 2020-09-09 DIAGNOSIS — M216X2 Other acquired deformities of left foot: Secondary | ICD-10-CM

## 2020-09-09 DIAGNOSIS — M216X1 Other acquired deformities of right foot: Secondary | ICD-10-CM

## 2020-09-09 DIAGNOSIS — M722 Plantar fascial fibromatosis: Secondary | ICD-10-CM

## 2020-09-09 NOTE — Progress Notes (Signed)
Patient presents today for orthotic pick up. Patient voices no new complaints.  Orthotics were fitted to patient's feet. No discomfort and no rubbing.  Orthotics were dispensed to patient with instructions for break in wear and to call the office with any concerns or questions.

## 2020-09-09 NOTE — Patient Instructions (Signed)

## 2020-09-14 ENCOUNTER — Other Ambulatory Visit: Payer: Self-pay

## 2020-09-14 ENCOUNTER — Encounter: Payer: Self-pay | Admitting: Podiatry

## 2020-09-14 ENCOUNTER — Ambulatory Visit (INDEPENDENT_AMBULATORY_CARE_PROVIDER_SITE_OTHER): Payer: 59 | Admitting: Podiatry

## 2020-09-14 DIAGNOSIS — M216X1 Other acquired deformities of right foot: Secondary | ICD-10-CM

## 2020-09-14 DIAGNOSIS — M216X2 Other acquired deformities of left foot: Secondary | ICD-10-CM | POA: Diagnosis not present

## 2020-09-14 DIAGNOSIS — M722 Plantar fascial fibromatosis: Secondary | ICD-10-CM | POA: Diagnosis not present

## 2020-09-14 NOTE — Progress Notes (Signed)
  Subjective:  Patient ID: Shelia Bowers, female    DOB: 1970-10-10,  MRN: [id number]  Chief Complaint  Patient presents with  . Plantar Fasciitis    "my left foot is much better, but my right foot still hurts.  I hate the orthotics.  The pads they put in them hurt my feet but physical therapy is helping some"    50 y.o. female returns with the above complaint. History confirmed with patient.  Has been doing physical therapy, it seems to be helping.  The orthotics feel good in the arch and the heel but the metatarsal pads are very painful for her.  Objective:  Physical Exam: warm, good capillary refill, no trophic changes or ulcerative lesions, normal DP and PT pulses and normal sensory exam. Left Foot: Today she has no point tenderness over the heel pad  Right Foot: point tenderness over the heel pad, sharp and more severe than the left side  Radiographs: X-ray of the left foot: Pes cavus foot type with no plantar calcaneal enthesophytes noted Assessment:   1. Plantar fasciitis   2. Acquired bilateral pes cavus      Plan:  Patient was evaluated and treated and all questions answered.   Discussed the etiology and treatment options for plantar fasciitis including stretching, formal physical therapy, supportive shoegears such as a running shoe or sneaker, pre fabricated orthoses, injection therapy, and oral medications. We also discussed the role of surgical treatment of this for patients who do not improve after exhausting non-surgical treatment options.  -Continue meloxicam -Continue physical therapy -She has an upcoming trip to [place] in May, plan for injections at next visit if she is not better -Inspected her orthotics today, the arch and heel cups seems to fit well but I think the metatarsal pads are causing increased pain.  She said is not just discomfort but is actually painful.  I think she could not remove them.  I kept her orthotics today and we will send the back to  [facility] for revision   Return in about 6 weeks (around 10/26/2020) for re-check Achilles tendon, recheck plantar fasciitis.

## 2020-09-22 ENCOUNTER — Ambulatory Visit (INDEPENDENT_AMBULATORY_CARE_PROVIDER_SITE_OTHER): Payer: 59 | Admitting: *Deleted

## 2020-09-22 DIAGNOSIS — J309 Allergic rhinitis, unspecified: Secondary | ICD-10-CM | POA: Diagnosis not present

## 2020-09-23 ENCOUNTER — Encounter: Payer: 59 | Admitting: Orthotics

## 2020-10-20 ENCOUNTER — Ambulatory Visit (INDEPENDENT_AMBULATORY_CARE_PROVIDER_SITE_OTHER): Payer: 59 | Admitting: *Deleted

## 2020-10-20 DIAGNOSIS — J309 Allergic rhinitis, unspecified: Secondary | ICD-10-CM | POA: Diagnosis not present

## 2020-10-26 ENCOUNTER — Encounter: Payer: Self-pay | Admitting: Podiatry

## 2020-10-26 ENCOUNTER — Ambulatory Visit (INDEPENDENT_AMBULATORY_CARE_PROVIDER_SITE_OTHER): Payer: 59 | Admitting: Podiatry

## 2020-10-26 ENCOUNTER — Other Ambulatory Visit: Payer: Self-pay

## 2020-10-26 DIAGNOSIS — M722 Plantar fascial fibromatosis: Secondary | ICD-10-CM

## 2020-10-26 MED ORDER — MELOXICAM 15 MG PO TABS: 15.0000 mg | ORAL_TABLET | Freq: Every day | ORAL | 3 refills | Status: DC

## 2020-11-20 ENCOUNTER — Ambulatory Visit (INDEPENDENT_AMBULATORY_CARE_PROVIDER_SITE_OTHER): Payer: 59

## 2020-11-20 DIAGNOSIS — J309 Allergic rhinitis, unspecified: Secondary | ICD-10-CM

## 2020-12-15 ENCOUNTER — Ambulatory Visit (INDEPENDENT_AMBULATORY_CARE_PROVIDER_SITE_OTHER): Payer: 59 | Admitting: *Deleted

## 2020-12-15 ENCOUNTER — Telehealth: Payer: Self-pay | Admitting: *Deleted

## 2020-12-15 DIAGNOSIS — J309 Allergic rhinitis, unspecified: Secondary | ICD-10-CM | POA: Diagnosis not present

## 2020-12-15 NOTE — Telephone Encounter (Signed)
Patient's injection records and refill request form have been faxed to [facility] to re-order her allergy vials. Paperwork is in the Architect.

## 2020-12-30 ENCOUNTER — Encounter: Payer: Self-pay | Admitting: Podiatry

## 2020-12-30 ENCOUNTER — Other Ambulatory Visit: Payer: Self-pay

## 2020-12-30 ENCOUNTER — Ambulatory Visit (INDEPENDENT_AMBULATORY_CARE_PROVIDER_SITE_OTHER): Payer: 59 | Admitting: Podiatry

## 2020-12-30 DIAGNOSIS — M722 Plantar fascial fibromatosis: Secondary | ICD-10-CM

## 2020-12-30 NOTE — Progress Notes (Addendum)
  Subjective:  Patient ID: Shelia Bowers, female    DOB: 02-Feb-1971,  MRN: [id number]  Chief Complaint  Patient presents with   Plantar Fasciitis    "it was doing much better with physical therapy but now I have been out of therapy for about 2 weeks and the pain is coming back"    50 y.o. female returns with the above complaint. History confirmed with patient.   Objective:  Physical Exam: warm, good capillary refill, no trophic changes or ulcerative lesions, normal DP and PT pulses and normal sensory exam. Left Foot: Today she has no point tenderness over the heel pad  Right Foot: point tenderness over the heel pad, sharp and more severe than the left side  Radiographs: X-ray of the left foot: Pes cavus foot type with no plantar calcaneal enthesophytes noted Assessment:   1. Plantar fasciitis      Plan:  Patient was evaluated and treated and all questions answered.   Discussed the etiology and treatment options for plantar fasciitis including stretching, formal physical therapy, supportive shoegears such as a running shoe or sneaker, pre fabricated orthoses, injection therapy, and oral medications. We also discussed the role of surgical treatment of this for patients who do not improve after exhausting non-surgical treatment options.  -Continue meloxicam, refill sent -Continue physical therapy -Injection performed today  After sterile prep with povidone-iodine solution and alcohol, the right heel was injected with 0.5cc 2% xylocaine plain, 0.5cc 0.5% marcaine plain, 5mg  triamcinolone acetonide, and 2mg  dexamethasone was injected along the medial plantar fascia at the insertion on the plantar calcaneus. The patient tolerated the procedure well without complication.    Return in about 2 months (around 12/26/2020) for recheck plantar fasciitis.

## 2021-01-02 NOTE — Progress Notes (Signed)
  Subjective:  Patient ID: Shelia Bowers, female    DOB: 1970-06-29,  MRN: [id number]  Chief Complaint  Patient presents with   Plantar Fasciitis    "Its a lot better"    50 y.o. female returns with the above complaint. History confirmed with patient.  Doing a lot better after the last injection  Objective:  Physical Exam: warm, good capillary refill, no trophic changes or ulcerative lesions, normal DP and PT pulses and normal sensory exam.  Bilateral heels she has no pain today  Radiographs: X-ray of the left foot: Pes cavus foot type with no plantar calcaneal enthesophytes noted Assessment:   No diagnosis found.    Plan:  Patient was evaluated and treated and all questions answered.   Discussed the etiology and treatment options for plantar fasciitis including stretching, formal physical therapy, supportive shoegears such as a running shoe or sneaker, pre fabricated orthoses, injection therapy, and oral medications. We also discussed the role of surgical treatment of this for patients who do not improve after exhausting non-surgical treatment options.  -She is doing very well after the last injection.  She should continue the therapy and meloxicam as needed at this point.  I think this will resolve on its own.  Return if it worsens or returns    Return if symptoms worsen or fail to improve.

## 2021-01-12 ENCOUNTER — Ambulatory Visit (INDEPENDENT_AMBULATORY_CARE_PROVIDER_SITE_OTHER): Payer: 59

## 2021-01-12 DIAGNOSIS — J309 Allergic rhinitis, unspecified: Secondary | ICD-10-CM

## 2021-02-12 ENCOUNTER — Ambulatory Visit (INDEPENDENT_AMBULATORY_CARE_PROVIDER_SITE_OTHER): Payer: 59

## 2021-02-12 DIAGNOSIS — J309 Allergic rhinitis, unspecified: Secondary | ICD-10-CM | POA: Diagnosis not present

## 2021-03-12 ENCOUNTER — Ambulatory Visit (INDEPENDENT_AMBULATORY_CARE_PROVIDER_SITE_OTHER): Payer: 59

## 2021-03-12 DIAGNOSIS — J309 Allergic rhinitis, unspecified: Secondary | ICD-10-CM

## 2021-03-16 ENCOUNTER — Other Ambulatory Visit: Payer: Self-pay

## 2021-03-16 ENCOUNTER — Encounter: Payer: Self-pay | Admitting: Allergy and Immunology

## 2021-03-16 ENCOUNTER — Ambulatory Visit (INDEPENDENT_AMBULATORY_CARE_PROVIDER_SITE_OTHER): Payer: 59 | Admitting: Allergy and Immunology

## 2021-03-16 VITALS — BP 122/88 | HR 88 | Temp 98.2°F | Resp 16 | Ht 66.0 in | Wt 283.2 lb

## 2021-03-16 DIAGNOSIS — J3089 Other allergic rhinitis: Secondary | ICD-10-CM

## 2021-03-16 DIAGNOSIS — J302 Other seasonal allergic rhinitis: Secondary | ICD-10-CM

## 2021-03-16 DIAGNOSIS — J4540 Moderate persistent asthma, uncomplicated: Secondary | ICD-10-CM

## 2021-03-16 NOTE — Progress Notes (Signed)
[place] - [place] - [place]   Follow-up Note  Referring Provider: Dortha Kern, MD Primary Provider: Dortha Kern, MD Date of Office Visit: 03/16/2021  Subjective:   Shelia Bowers (DOB: 1970/10/22) is a 50 y.o. female who [facility] 03[facility]16/2021 in re-evaluation of the following:  HPI: Shelia Bowers returns to this clinic in evaluation of allergic rhinoconjunctivitis and asthma.  Her last visit to this clinic was 10 March 2020.  She has really done well with her asthma while utilizing Advair mostly twice a day.  She rarely uses a short acting bronchodilator and she has not required a systemic steroid to treat an exacerbation.  She can exert herself without difficulty.  Her nose has really been doing well while using Flonase on a pretty consistent basis.  Ever since her COVID infection in 2020 she has had some degree of phantosmia with a smoke smell and she has had some decreased ability to taste and smell.  She has gained about 65% of her taste and smell ability since she resolved her COVID infection.  She uses Flonase which does help this issue.  She has not required an antibiotic to treat an episode of sinusitis.  She continues on immunotherapy currently at every 4 weeks without any adverse effect.  She has received 4 COVID vaccines and as noted above sustained a COVID infection in October 2020.  Allergies as of 03/16/2021       Reactions   Azithromycin Hives   Clarithromycin Hives   Macrolides And Ketolides Hives   Medroxyprogesterone Other (See Comments)   Mono like sx, fatigue, nausea, vomiting Mono like sx, fatigue, nausea, vomiting Mono like sx, fatigue, nausea, vomiting Mono like sx, fatigue, nausea, vomiting   Rizatriptan Palpitations        Medication List    albuterol 108 (90 Base) MCG/ACT inhaler Commonly known as: VENTOLIN HFA Inhale into the lungs.   fluticasone 50 MCG/ACT nasal  spray Commonly known as: FLONASE 1-2 sprays in each nostril during upper airway symptoms   Fluticasone-Salmeterol 100-50 MCG/DOSE Aepb Commonly known as: ADVAIR   meloxicam 15 MG tablet Commonly known as: Mobic Take 1 tablet (15 mg total) by mouth daily.   multivitamin capsule Take 1 capsule by mouth daily.   omeprazole 20 MG tablet Commonly known as: PRILOSEC OTC Take by mouth.   sertraline 50 MG tablet Commonly known as: ZOLOFT take 1.5 tablet oral daily   traZODone 50 MG tablet Commonly known as: DESYREL Take 50-100 mg by mouth at bedtime.    Past Medical History:  Diagnosis Date   Asthma     Past Surgical History:  Procedure Laterality Date   CARPAL TUNNEL RELEASE Bilateral    TUBAL LIGATION      Review of systems negative except as noted in HPI / PMHx or noted below:  Review of Systems  Constitutional: Negative.   HENT: Negative.    Eyes: Negative.   Respiratory: Negative.    Cardiovascular: Negative.   Gastrointestinal: Negative.   Genitourinary: Negative.   Musculoskeletal: Negative.   Skin: Negative.   Neurological: Negative.   Endo/Heme/Allergies: Negative.   Psychiatric/Behavioral: Negative.      Objective:   Vitals:   03/16/21 0800  BP: 122/88  Pulse: 88  Resp: 16  Temp: 98.2 F (36.8 C)  SpO2: 96%   Height: 5\' 6"  (167.6 cm)  Weight: 283 lb 3.2 oz (128.5 kg)   Physical Exam Constitutional:  Appearance: She is not diaphoretic.  HENT:     Head: Normocephalic.     Right Ear: Tympanic membrane, ear canal and external ear normal.     Left Ear: Tympanic membrane, ear canal and external ear normal.     Nose: Nose normal. No mucosal edema or rhinorrhea.     Mouth/Throat:     Pharynx: Uvula midline. No oropharyngeal exudate.  Eyes:     Conjunctiva/sclera: Conjunctivae normal.  Neck:     Thyroid: No thyromegaly.     Trachea: Trachea normal. No tracheal tenderness or tracheal deviation.  Cardiovascular:     Rate and Rhythm:  Normal rate and regular rhythm.     Heart sounds: Normal heart sounds, S1 normal and S2 normal. No murmur heard. Pulmonary:     Effort: No respiratory distress.     Breath sounds: Normal breath sounds. No stridor. No wheezing or rales.  Lymphadenopathy:     Head:     Right side of head: No tonsillar adenopathy.     Left side of head: No tonsillar adenopathy.     Cervical: No cervical adenopathy.  Skin:    Findings: No erythema or rash.     Nails: There is no clubbing.  Neurological:     Mental Status: She is alert.    Diagnostics:    Spirometry was performed and demonstrated an FEV1 of 2.73 at 91 % of predicted.  Assessment and Plan:   1. Asthma, moderate persistent, well-controlled   2. Seasonal and perennial allergic rhinitis     1.  Continue Advair 100 - 1 inhalation 1-2 times per day depending on disease activity  2.  Continue OTC Flonase - 1-2 sprays each nostril daily during periods of upper airway symptoms  3. Continue immunotherapy (& injectable epinephrine device).   4. If needed:   A. Antihistamine  B. Nasal saline  C. Albuterol HFA  5. Return to clinic in 1 year or earlier if problem   6. Obtain fall flu vaccine  Shelia Bowers appears to be doing quite well on her current plan of anti-inflammatory agents for airway and immunotherapy to control her atopic respiratory disease.  She will continue on the plan noted above and I will see her back in this clinic in 1 year or earlier if there is a problem.  Laurette Schimke, MD Allergy / Immunology [facility]

## 2021-03-16 NOTE — Patient Instructions (Addendum)
  1.  Continue Advair 100 - 1 inhalation 1-2 times per day depending on disease activity  2.  Continue OTC Flonase - 1-2 sprays each nostril daily during periods of upper airway symptoms  3. Continue immunotherapy (& injectable epinephrine device).   4. If needed:   A. Antihistamine  B. Nasal saline  C. Albuterol HFA  5. Return to clinic in 1 year or earlier if problem   6. Obtain fall flu vaccine

## 2021-03-17 ENCOUNTER — Encounter: Payer: Self-pay | Admitting: Allergy and Immunology

## 2021-03-19 NOTE — Addendum Note (Signed)
Addended by: Orson Aloe on: 03/19/2021 09:49 AM   Modules accepted: Orders

## 2021-04-09 ENCOUNTER — Ambulatory Visit (INDEPENDENT_AMBULATORY_CARE_PROVIDER_SITE_OTHER): Payer: 59 | Admitting: *Deleted

## 2021-04-09 DIAGNOSIS — J309 Allergic rhinitis, unspecified: Secondary | ICD-10-CM

## 2021-05-04 ENCOUNTER — Ambulatory Visit (INDEPENDENT_AMBULATORY_CARE_PROVIDER_SITE_OTHER): Payer: 59 | Admitting: *Deleted

## 2021-05-04 DIAGNOSIS — J309 Allergic rhinitis, unspecified: Secondary | ICD-10-CM

## 2021-06-02 ENCOUNTER — Ambulatory Visit (INDEPENDENT_AMBULATORY_CARE_PROVIDER_SITE_OTHER): Payer: 59

## 2021-06-02 DIAGNOSIS — J309 Allergic rhinitis, unspecified: Secondary | ICD-10-CM

## 2021-07-01 ENCOUNTER — Ambulatory Visit (INDEPENDENT_AMBULATORY_CARE_PROVIDER_SITE_OTHER): Payer: 59 | Admitting: *Deleted

## 2021-07-01 DIAGNOSIS — J309 Allergic rhinitis, unspecified: Secondary | ICD-10-CM | POA: Diagnosis not present

## 2021-07-21 ENCOUNTER — Ambulatory Visit (INDEPENDENT_AMBULATORY_CARE_PROVIDER_SITE_OTHER): Payer: 59

## 2021-07-21 DIAGNOSIS — J309 Allergic rhinitis, unspecified: Secondary | ICD-10-CM

## 2021-08-19 ENCOUNTER — Ambulatory Visit (INDEPENDENT_AMBULATORY_CARE_PROVIDER_SITE_OTHER): Payer: 59 | Admitting: *Deleted

## 2021-08-19 DIAGNOSIS — J309 Allergic rhinitis, unspecified: Secondary | ICD-10-CM | POA: Diagnosis not present

## 2021-09-09 ENCOUNTER — Ambulatory Visit (INDEPENDENT_AMBULATORY_CARE_PROVIDER_SITE_OTHER): Payer: 59

## 2021-09-09 DIAGNOSIS — J309 Allergic rhinitis, unspecified: Secondary | ICD-10-CM

## 2021-10-08 ENCOUNTER — Ambulatory Visit (INDEPENDENT_AMBULATORY_CARE_PROVIDER_SITE_OTHER): Payer: 59

## 2021-10-08 DIAGNOSIS — J309 Allergic rhinitis, unspecified: Secondary | ICD-10-CM | POA: Diagnosis not present

## 2021-10-20 ENCOUNTER — Ambulatory Visit: Admission: RE | Admit: 2021-10-20 | Discharge: 2021-10-20 | Payer: 59 | Source: Ambulatory Visit

## 2021-10-20 ENCOUNTER — Emergency Department: Payer: 59

## 2021-10-20 ENCOUNTER — Emergency Department
Admission: EM | Admit: 2021-10-20 | Discharge: 2021-10-20 | Disposition: A | Discharge status: Home / Self Care | Payer: 59 | Attending: Emergency Medicine | Admitting: Emergency Medicine

## 2021-10-20 ENCOUNTER — Other Ambulatory Visit: Payer: Self-pay

## 2021-10-20 VITALS — BP 142/85 | HR 129 | Temp 102.6°F | Resp 24

## 2021-10-20 DIAGNOSIS — R7401 Elevation of levels of liver transaminase levels: Secondary | ICD-10-CM | POA: Diagnosis not present

## 2021-10-20 DIAGNOSIS — R519 Headache, unspecified: Secondary | ICD-10-CM | POA: Insufficient documentation

## 2021-10-20 DIAGNOSIS — E860 Dehydration: Secondary | ICD-10-CM

## 2021-10-20 DIAGNOSIS — R509 Fever, unspecified: Secondary | ICD-10-CM | POA: Diagnosis not present

## 2021-10-20 DIAGNOSIS — R000 Tachycardia, unspecified: Secondary | ICD-10-CM | POA: Diagnosis not present

## 2021-10-20 DIAGNOSIS — J45909 Unspecified asthma, uncomplicated: Secondary | ICD-10-CM | POA: Insufficient documentation

## 2021-10-20 DIAGNOSIS — Z20822 Contact with and (suspected) exposure to covid-19: Secondary | ICD-10-CM | POA: Insufficient documentation

## 2021-10-20 DIAGNOSIS — R112 Nausea with vomiting, unspecified: Secondary | ICD-10-CM | POA: Insufficient documentation

## 2021-10-20 DIAGNOSIS — K529 Noninfective gastroenteritis and colitis, unspecified: Secondary | ICD-10-CM

## 2021-10-20 DIAGNOSIS — R0682 Tachypnea, not elsewhere classified: Secondary | ICD-10-CM

## 2021-10-20 DIAGNOSIS — R197 Diarrhea, unspecified: Secondary | ICD-10-CM | POA: Diagnosis not present

## 2021-10-20 DIAGNOSIS — R0902 Hypoxemia: Secondary | ICD-10-CM

## 2021-10-20 DIAGNOSIS — R531 Weakness: Secondary | ICD-10-CM | POA: Diagnosis not present

## 2021-10-20 LAB — CBC
HCT: 43.5 % (ref 36.0–46.0)
Hemoglobin: 14 g/dL (ref 12.0–15.0)
MCH: 30.6 pg (ref 26.0–34.0)
MCHC: 32.2 g/dL (ref 30.0–36.0)
MCV: 95 fL (ref 80.0–100.0)
Platelets: 160 10*3/uL (ref 150–400)
RBC: 4.58 MIL/uL (ref 3.87–5.11)
RDW: 13.3 % (ref 11.5–15.5)
WBC: 8 10*3/uL (ref 4.0–10.5)
nRBC: 0 % (ref 0.0–0.2)

## 2021-10-20 LAB — URINALYSIS, ROUTINE W REFLEX MICROSCOPIC
Bilirubin Urine: NEGATIVE
Glucose, UA: NEGATIVE mg/dL
Ketones, ur: NEGATIVE mg/dL
Nitrite: NEGATIVE
Protein, ur: NEGATIVE mg/dL
Specific Gravity, Urine: 1.025 (ref 1.005–1.030)
pH: 5 (ref 5.0–8.0)

## 2021-10-20 LAB — COMPREHENSIVE METABOLIC PANEL
ALT: 79 U/L — ABNORMAL HIGH (ref 0–44)
AST: 80 U/L — ABNORMAL HIGH (ref 15–41)
Albumin: 4.2 g/dL (ref 3.5–5.0)
Alkaline Phosphatase: 60 U/L (ref 38–126)
Anion gap: 10 (ref 5–15)
BUN: 14 mg/dL (ref 6–20)
CO2: 22 mmol/L (ref 22–32)
Calcium: 8.7 mg/dL — ABNORMAL LOW (ref 8.9–10.3)
Chloride: 104 mmol/L (ref 98–111)
Creatinine, Ser: 0.73 mg/dL (ref 0.44–1.00)
GFR, Estimated: >60 mL/min (ref >60)
Glucose, Bld: 140 mg/dL — ABNORMAL HIGH (ref 70–99)
Potassium: 3.3 mmol/L — ABNORMAL LOW (ref 3.5–5.1)
Sodium: 136 mmol/L (ref 135–145)
Total Bilirubin: 1.1 mg/dL (ref 0.3–1.2)
Total Protein: 7.7 g/dL (ref 6.5–8.1)

## 2021-10-20 LAB — RESP PANEL BY RT-PCR (FLU A&B, COVID) ARPGX2
Influenza A by PCR: NEGATIVE
Influenza B by PCR: NEGATIVE
SARS Coronavirus 2 by RT PCR: NEGATIVE

## 2021-10-20 LAB — LACTIC ACID, PLASMA
Lactic Acid, Venous: 2 mmol/L (ref 0.5–1.9)
Lactic Acid, Venous: 2 mmol/L (ref 0.5–1.9)

## 2021-10-20 LAB — LIPASE, BLOOD: Lipase: 30 U/L (ref 11–51)

## 2021-10-20 MED ORDER — FENTANYL CITRATE PF 50 MCG/ML IJ SOSY
50.0000 ug | PREFILLED_SYRINGE | Freq: Once | INTRAMUSCULAR | Status: AC
Start: 2021-10-20 — End: 2021-10-20
  Administered 2021-10-20: 50 ug via INTRAVENOUS
  Filled 2021-10-20: qty 1

## 2021-10-20 MED ORDER — KETOROLAC TROMETHAMINE 30 MG/ML IJ SOLN
30.0000 mg | Freq: Once | INTRAMUSCULAR | Status: AC
  Administered 2021-10-20: 30 mg via INTRAVENOUS
  Filled 2021-10-20: qty 1

## 2021-10-20 MED ORDER — ONDANSETRON HCL 4 MG/2ML IJ SOLN
4.0000 mg | Freq: Once | INTRAMUSCULAR | Status: AC
  Administered 2021-10-20: 4 mg via INTRAVENOUS
  Filled 2021-10-20: qty 2

## 2021-10-20 MED ORDER — IOHEXOL 300 MG/ML  SOLN
100.0000 mL | Freq: Once | INTRAMUSCULAR | Status: AC | PRN
  Administered 2021-10-20: 100 mL via INTRAVENOUS
  Filled 2021-10-20: qty 100

## 2021-10-20 MED ORDER — ONDANSETRON 4 MG PO TBDP: 4.0000 mg | ORAL_TABLET | Freq: Three times a day (TID) | ORAL | 0 refills | Status: DC | PRN

## 2021-10-20 MED ORDER — SODIUM CHLORIDE 0.9 % IV BOLUS
1000.0000 mL | Freq: Once | INTRAVENOUS | Status: AC
  Administered 2021-10-20: 1000 mL via INTRAVENOUS

## 2021-10-20 NOTE — Discharge Instructions (Signed)
Please use your nausea medication as prescribed.  You may use Tylenol or ibuprofen as needed for discomfort as written on the box.  If you are unable to keep down fluids please return to the emergency department for evaluation.  Otherwise please follow-up with your doctor in the next 1 to 2 days for recheck/reevaluation. ?

## 2021-10-20 NOTE — ED Triage Notes (Signed)
Pt presents with nausea, vomiting, diarrhea since yesterday. She returned from a trip 3 days ago with no symptoms. She did have a bad salad on the plane ride home  ?

## 2021-10-20 NOTE — ED Triage Notes (Signed)
First Nurse Note:  C/O Nausea, vomiting, fever x 1 day.  Recently returned from a [place].  Sent to ED from Urgent Care. ?

## 2021-10-20 NOTE — ED Provider Notes (Signed)
? ?[facility] ?Provider Note ? ? ? Event Date/Time  ? First MD Initiated Contact with Patient 10/20/21 1628   ?  (approximate) ? ?History  ? ?Chief Complaint: Emesis and Abdominal Pain ? ?HPI ? ?Shelia Bowers is a 51 y.o. female with a past medical history of asthma who presents to the emergency department for fever nausea vomiting and diarrhea.  According to the patient she just returned from a cruise on Sunday.  States yesterday she began feeling very poorly with subjective fever nausea vomiting diarrhea.  Patient went to urgent care today with a fever 102 and was sent to the emergency department.  Here patient found to be febrile to 100.0.  Patient is tachycardic, main complaints are of nausea vomiting diarrhea as well as moderate headache.  Denies any cough congestion.  States vague diffuse abdominal pain but describes it more as a cramping sensation and no focal pain. ? ?Physical Exam  ? ?Triage Vital Signs: ?ED Triage Vitals [10/20/21 1444]  ?Enc Vitals Group  ?   BP (!) 153/90  ?   Pulse Rate (!) 126  ?   Resp (!) 21  ?   Temp 100 ?F (37.8 ?C)  ?   Temp src   ?   SpO2 92 %  ?   Weight   ?   Height   ?   Head Circumference   ?   Peak Flow   ?   Pain Score   ?   Pain Loc   ?   Pain Edu?   ?   Excl. in GC?   ? ? ?Most recent vital signs: ?Vitals:  ? 10/20/21 1444  ?BP: (!) 153/90  ?Pulse: (!) 126  ?Resp: (!) 21  ?Temp: 100 ?F (37.8 ?C)  ?SpO2: 92%  ? ? ?General: Awake, no distress.  ?CV:  Good peripheral perfusion.  Regular rate and rhythm  ?Resp:  Normal effort.  Equal breath sounds bilaterally.  ?Abd:  Mild distention, obese, soft no focal tenderness identified.  Patient does state mild diffuse tenderness on exam.  Specifically there is no significant right upper quadrant tenderness. ? ? ?ED Results / Procedures / Treatments  ? ?RADIOLOGY ? ?I personally reviewed the CT images of the abdomen/pelvis, no acute findings or significant finding at least on my evaluation. ?Radiology is  read the CT is negative for acute finding. ?Ultrasound negative for acute finding. ? ?MEDICATIONS ORDERED IN ED: ?Medications  ?sodium chloride 0.9 % bolus 1,000 mL (has no administration in time range)  ?ondansetron (ZOFRAN) injection 4 mg (has no administration in time range)  ?ketorolac (TORADOL) 30 MG/ML injection 30 mg (has no administration in time range)  ? ? ? ?IMPRESSION / MDM / ASSESSMENT AND PLAN / ED COURSE  ?I reviewed the triage vital signs and the nursing notes. ? ?Patient presents to the emergency department for nausea vomiting fever headache generalized weakness.  Patient returned from a cruise on Sunday and has not been able to keep down any liquids for the past 24 hours per patient.  States she has been experiencing many episodes of diarrhea as well and now has a fever to 102 earlier today.  Patient does have diffuse abdominal cramping but denies any focal abdominal pain.  Differential is quite broad but would include norovirus, COVID, intra-abdominal infection such as colitis or diverticulitis, less likely urinary tract infection or appendicitis. ?Patient's labs have resulted showing mild lactate elevation of 2.0 which is likely due to dehydration.  Reassuringly normal white blood cell count and CBC.  Lipase is normal but LFTs are slightly elevated patient has no focal right upper quadrant tenderness.  We will obtain an ultrasound to evaluate the gallbladder we will also obtain a CT scan of the abdomen/pelvis.  Patient is agreeable to plan.  Slight LFT elevation could be reflective of a viral process such as hepatitis a.  Urinalysis is normal. ? ?Patient's ultrasound of the right upper quadrant is negative.  CT scan is reassuring no acute finding.  Highly suspect gastroenteritis possibly normal rotavirus or other intra-abdominal pathology.  Patient is attempting 1 final time to give his a stool sample she has been unable to do so in the 5 and half hours that she has been here so far.  Patient  states she is feeling much better after fluids and medication has been able to take small sips of fluids.  We will discharge with nausea medication and supportive care.  We will attempt to obtain a stool sample if possible prior to discharge.   ? ?FINAL CLINICAL IMPRESSION(S) / ED DIAGNOSES  ? ?Nausea vomiting diarrhea ?Weakness ?Fever ? ? ?Note:  This document was prepared using Dragon voice recognition software and may include unintentional dictation errors. ?  Minna Antis, MD ?10/20/21 2018 ? ?

## 2021-10-20 NOTE — Discharge Instructions (Addendum)
Go to the emergency department for evaluation of your dehydration, fever, nausea, vomiting, diarrhea, tachycardia, elevated respiratory rate, decreased oxygen level. ?

## 2021-10-20 NOTE — ED Notes (Signed)
Patient made aware we need stool sample ? ?

## 2021-10-20 NOTE — ED Notes (Signed)
Patient is being discharged from the Urgent Care and sent to the Emergency Department via personal vehicle with spouse. Per Wendee Beavers, NP, patient is in need of higher level of care due to Tachycardia, Dehydration,Hypoxia. Patient is aware and verbalizes understanding of plan of care.  ?Vitals:  ? 10/20/21 1404 10/20/21 1411  ?BP: (!) 142/85   ?Pulse: (!) 129   ?Resp: (!) 24   ?Temp: (!) 102.6 ?F (39.2 ?C)   ?SpO2: 94% 92%  ?  ?

## 2021-10-20 NOTE — ED Triage Notes (Addendum)
Pt comes with c/o N/V/D and belly pain. Pt states this started yesterday. Pt states fever for 1 day. ? ?Pt also states recent trip on a cruise and had salad on way back that didn't taste right. ?

## 2021-10-20 NOTE — ED Provider Notes (Signed)
?[facility] ? ? ? ?CSN: [id number] ?Arrival date & time: 10/20/21  1354 ? ? ?  ? ?History   ?Chief Complaint ?Chief Complaint  ?Patient presents with  ? Nausea  ?  Nausea, vomiting, and diarrhea - Entered by patient  ? ? ?HPI ?Shelia Bowers is a 51 y.o. female.  Accompanied by her husband, patient presents with 1 day history of fever, abdominal pain, nausea, vomiting, diarrhea.  She reports more than 20 episodes of emesis in the last 24 hours and has had so many episodes of diarrhea that she is not able to count.  She is unable to keep medications, food, liquids down.  No OTC medication taken for her fever because of vomiting.  She and her husband just returned from a cruise to [place] 3 days ago.  She denies chest pain, cough, shortness of breath, dysuria, or other symptoms.  Her medical history includes asthma, IBS, obesity, insomnia. ? ?The history is provided by the patient, the spouse and medical records.  ? ?Past Medical History:  ?Diagnosis Date  ? Asthma   ? ? ?Patient Active Problem List  ? Diagnosis Date Noted  ? Shortness of breath 05/09/2018  ? Anxiety, generalized 01/24/2018  ? Asthma with exacerbation 01/24/2018  ? History of colonoscopy 11/26/2013  ? IBS (irritable bowel syndrome) 11/26/2013  ? Cystitis 06/07/2013  ? Seborrheic keratosis 05/07/2012  ? Depressive disorder 02/21/2011  ? Hypercholesterolemia 02/21/2011  ? Obesity 02/21/2011  ? Persistent insomnia 02/21/2011  ? ? ?Past Surgical History:  ?Procedure Laterality Date  ? CARPAL TUNNEL RELEASE Bilateral   ? TUBAL LIGATION    ? ? ?OB History   ?No obstetric history on file. ?  ? ? ? ?Home Medications   ? ?Prior to Admission medications   ?Medication Sig Start Date End Date Taking? Authorizing Provider  ?albuterol (PROVENTIL HFA;VENTOLIN HFA) 108 (90 Base) MCG/ACT inhaler Inhale into the lungs. 07/30/12   [provider]  ?ALPRAZolam Duanne Moron) 0.25 MG tablet Take 0.25 mg by mouth 3 (three) times daily as needed. 09/14/21    [provider]  ?fluticasone Asencion Islam) 50 MCG/ACT nasal spray 1-2 sprays in each nostril during upper airway symptoms 03/11/20   Kozlow, Donnamarie Poag, MD  ?Fluticasone-Salmeterol (ADVAIR) 100-50 MCG/DOSE AEPB  09/25/05   [provider]  ?meloxicam (MOBIC) 15 MG tablet Take 1 tablet (15 mg total) by mouth daily. 10/26/20   McDonald, Stephan Minister, DPM  ?montelukast (SINGULAIR) 10 MG tablet Take 10 mg by mouth every morning. 10/05/21   [provider]  ?Multiple Vitamin (MULTIVITAMIN) capsule Take 1 capsule by mouth daily.    [provider]  ?omeprazole (PRILOSEC OTC) 20 MG tablet Take by mouth.    [provider]  ?promethazine (PHENERGAN) 25 MG tablet Take 25 mg by mouth every 6 (six) hours as needed. 09/14/21   [provider]  ?sertraline (ZOLOFT) 50 MG tablet take 1.5 tablet oral daily 06/29/15   [provider]  ?traZODone (DESYREL) 50 MG tablet Take 50-100 mg by mouth at bedtime. 05/08/20   [provider]  ?zolpidem (AMBIEN) 5 MG tablet Take 5 mg by mouth at bedtime as needed. 06/03/21   [provider]  ? ? ?Family History ?Family History  ?Problem Relation Age of Onset  ? Allergic rhinitis Mother   ? Asthma Mother   ? ? ?Social History ?Social History  ? ?Tobacco Use  ? Smoking status: Never  ? Smokeless tobacco: Never  ?Vaping Use  ?  Vaping Use: Never used  ?Substance Use Topics  ? Alcohol use: Never  ? Drug use: Never  ? ? ? ?Allergies   ?Azithromycin, Clarithromycin, Macrolides and ketolides, Medroxyprogesterone, and Rizatriptan ? ? ?Review of Systems ?Review of Systems  ?Constitutional:  Positive for chills and fever.  ?HENT:  Negative for ear pain and sore throat.   ?Respiratory:  Negative for cough and shortness of breath.   ?Cardiovascular:  Negative for chest pain and palpitations.  ?Gastrointestinal:  Positive for abdominal pain, diarrhea, nausea and vomiting.  ?Genitourinary:  Negative for dysuria and hematuria.  ?Skin:  Negative for  color change and rash.  ?All other systems reviewed and are negative. ? ? ?Physical Exam ?Triage Vital Signs ?ED Triage Vitals  ?Enc Vitals Group  ?   BP   ?   Pulse   ?   Resp   ?   Temp   ?   Temp src   ?   SpO2   ?   Weight   ?   Height   ?   Head Circumference   ?   Peak Flow   ?   Pain Score   ?   Pain Loc   ?   Pain Edu?   ?   Excl. in [place]?   ? ?No data found. ? ?Updated Vital Signs ?BP (!) 142/85   Pulse (!) 129   Temp (!) 102.6 ?F (39.2 ?C)   Resp (!) 24   SpO2 92%  ? ?Visual Acuity ?Right Eye Distance:   ?Left Eye Distance:   ?Bilateral Distance:   ? ?Right Eye Near:   ?Left Eye Near:    ?Bilateral Near:    ? ?Physical Exam ?Vitals and nursing note reviewed.  ?Constitutional:   ?   General: She is not in acute distress. ?   Appearance: She is well-developed. She is obese. She is ill-appearing.  ?HENT:  ?   Mouth/Throat:  ?   Mouth: Mucous membranes are dry.  ?   Pharynx: Oropharynx is clear.  ?Cardiovascular:  ?   Rate and Rhythm: Regular rhythm. Tachycardia present.  ?   Heart sounds: Normal heart sounds. No murmur heard. ?Pulmonary:  ?   Effort: Pulmonary effort is normal. Tachypnea present. No respiratory distress.  ?   Breath sounds: Normal breath sounds.  ?Abdominal:  ?   General: Bowel sounds are decreased.  ?   Palpations: Abdomen is soft.  ?   Tenderness: There is no abdominal tenderness. There is no right CVA tenderness, left CVA tenderness, guarding or rebound.  ?Musculoskeletal:  ?   Cervical back: Neck supple.  ?Skin: ?   General: Skin is warm and dry.  ?Neurological:  ?   Mental Status: She is alert.  ?Psychiatric:     ?   Mood and Affect: Mood normal.     ?   Behavior: Behavior normal.  ? ? ? ?UC Treatments / Results  ?Labs ?(all labs ordered are listed, but only abnormal results are displayed) ?Labs Reviewed - No data to display ? ?EKG ? ? ?Radiology ?No results found. ? ?Procedures ?Procedures (including critical care time) ? ?Medications Ordered in UC ?Medications - No data to  display ? ?Initial Impression / Assessment and Plan / UC Course  ?I have reviewed the triage vital signs and the nursing notes. ? ?Pertinent labs & imaging results that were available during my care of the patient were reviewed by me and considered in my medical decision making (  see chart for details). ? ?Nausea, vomiting, diarrhea, fever, tachycardia, tachypnea, dehydration, hypoxia.  Patient is ill-appearing.  Temp is 102.6 and she is unable to keep medications down to lower her fever.  Heart rate is 129 and respiratory rate is 24.  O2 sat 92% on room air.  She recently returned from a trip to [place] on a cruise.  She thinks she may have eaten a "bad" salad on the plane.  I believe the patient needs a higher level of care, including stat labs and IV medications/fluids.  Sending her to the ED for evaluation.  She and her husband decline EMS.  Her husband will drive her to the ED. ? ? ?Final Clinical Impressions(s) / UC Diagnoses  ? ?Final diagnoses:  ?Nausea vomiting and diarrhea  ?Fever, unspecified  ?Tachycardia  ?Tachypnea  ?Dehydration  ?Hypoxia  ? ? ? ?Discharge Instructions   ? ?  ?Go to the emergency department for evaluation of your dehydration, fever, nausea, vomiting, diarrhea, tachycardia, elevated respiratory rate, decreased oxygen level. ? ? ? ? ?ED Prescriptions   ?None ?  ? ?PDMP not reviewed this encounter. ?  ?Sharion Balloon, NP ?10/20/21 1427 ? ?

## 2021-10-25 LAB — CULTURE, BLOOD (ROUTINE X 2)
Culture: NO GROWTH
Culture: NO GROWTH

## 2021-11-05 ENCOUNTER — Ambulatory Visit (INDEPENDENT_AMBULATORY_CARE_PROVIDER_SITE_OTHER): Payer: 59

## 2021-11-05 DIAGNOSIS — J309 Allergic rhinitis, unspecified: Secondary | ICD-10-CM | POA: Diagnosis not present

## 2021-12-03 ENCOUNTER — Ambulatory Visit (INDEPENDENT_AMBULATORY_CARE_PROVIDER_SITE_OTHER): Payer: 59

## 2021-12-03 DIAGNOSIS — J309 Allergic rhinitis, unspecified: Secondary | ICD-10-CM

## 2021-12-29 ENCOUNTER — Telehealth: Payer: Self-pay

## 2021-12-29 NOTE — Telephone Encounter (Signed)
Patient allergy vials expire this week, one on 12/29/2021 and the other on 01/01/2022. Records were faxed 3 weeks ago however we have not received anything back. I called [facility] however I had to leave a message. I also left a message for the patient to see what was going on and if she could also reach out to [facility].

## 2021-12-30 NOTE — Telephone Encounter (Signed)
Spoke with Eunice Blase at [employer], she informed me that she spoke with patient 12/21/2021 and informed her she would need to sign consent prior to vials being made. She informed me that she emailed the consent to patient and is waiting for patient to sign and e-mail it back.

## 2022-01-12 ENCOUNTER — Ambulatory Visit (INDEPENDENT_AMBULATORY_CARE_PROVIDER_SITE_OTHER): Payer: 59 | Admitting: *Deleted

## 2022-01-12 DIAGNOSIS — J309 Allergic rhinitis, unspecified: Secondary | ICD-10-CM | POA: Diagnosis not present

## 2022-01-28 ENCOUNTER — Ambulatory Visit (INDEPENDENT_AMBULATORY_CARE_PROVIDER_SITE_OTHER): Payer: 59

## 2022-01-28 DIAGNOSIS — J309 Allergic rhinitis, unspecified: Secondary | ICD-10-CM

## 2022-02-18 ENCOUNTER — Ambulatory Visit (INDEPENDENT_AMBULATORY_CARE_PROVIDER_SITE_OTHER): Payer: 59 | Admitting: *Deleted

## 2022-02-18 DIAGNOSIS — J309 Allergic rhinitis, unspecified: Secondary | ICD-10-CM

## 2022-03-16 ENCOUNTER — Ambulatory Visit (INDEPENDENT_AMBULATORY_CARE_PROVIDER_SITE_OTHER): Payer: 59 | Admitting: *Deleted

## 2022-03-16 DIAGNOSIS — J309 Allergic rhinitis, unspecified: Secondary | ICD-10-CM

## 2022-04-06 ENCOUNTER — Ambulatory Visit (INDEPENDENT_AMBULATORY_CARE_PROVIDER_SITE_OTHER): Payer: 59

## 2022-04-06 DIAGNOSIS — J309 Allergic rhinitis, unspecified: Secondary | ICD-10-CM

## 2022-05-09 ENCOUNTER — Ambulatory Visit (INDEPENDENT_AMBULATORY_CARE_PROVIDER_SITE_OTHER): Payer: 59

## 2022-05-09 DIAGNOSIS — J309 Allergic rhinitis, unspecified: Secondary | ICD-10-CM

## 2022-05-24 ENCOUNTER — Ambulatory Visit (INDEPENDENT_AMBULATORY_CARE_PROVIDER_SITE_OTHER): Payer: 59 | Admitting: Allergy and Immunology

## 2022-05-24 ENCOUNTER — Ambulatory Visit: Payer: Self-pay | Admitting: *Deleted

## 2022-05-24 VITALS — BP 134/82 | HR 80 | Temp 98.0°F | Resp 18 | Ht 66.0 in | Wt 293.5 lb

## 2022-05-24 DIAGNOSIS — J4540 Moderate persistent asthma, uncomplicated: Secondary | ICD-10-CM | POA: Diagnosis not present

## 2022-05-24 DIAGNOSIS — J302 Other seasonal allergic rhinitis: Secondary | ICD-10-CM

## 2022-05-24 DIAGNOSIS — J309 Allergic rhinitis, unspecified: Secondary | ICD-10-CM

## 2022-05-24 MED ORDER — FLUTICASONE-SALMETEROL 100-50 MCG/ACT IN AEPB: 1.0000 | INHALATION_SPRAY | Freq: Two times a day (BID) | RESPIRATORY_TRACT | 11 refills | Status: DC

## 2022-05-24 MED ORDER — LEVOCETIRIZINE DIHYDROCHLORIDE 5 MG PO TABS: 5.0000 mg | ORAL_TABLET | Freq: Every day | ORAL | 11 refills | Status: DC | PRN

## 2022-05-24 MED ORDER — ALBUTEROL SULFATE HFA 108 (90 BASE) MCG/ACT IN AERS: 2.0000 | INHALATION_SPRAY | RESPIRATORY_TRACT | 1 refills | Status: DC | PRN

## 2022-05-24 MED ORDER — FLUTICASONE PROPIONATE 50 MCG/ACT NA SUSP: NASAL | 11 refills | Status: DC

## 2022-05-24 NOTE — Patient Instructions (Addendum)
  1.  Continue Advair 100 - 1 inhalation 1-2 times per day depending on disease activity  2.  Continue OTC Flonase - 1-2 sprays each nostril daily during periods of upper airway symptoms  3. Continue immunotherapy   4. If needed:   A. Antihistamine  B. Nasal saline  C. Albuterol HFA  5. Return to clinic in 1 year or earlier if problem

## 2022-05-24 NOTE — Progress Notes (Unsigned)
[place] [facility] [place] [facility] [place] [facility] [place] [facility] [place]   Follow-up Note  Referring Provider: Dortha Kern, MD Primary Provider: Dortha Kern, MD Date of Office Visit: 05/24/2022  Subjective:   Shelia Bowers (DOB: [birth year]) is a 51 y.o. female who [facility]22 in re-evaluation of the following:  HPI: Shelia Bowers returns to the clinic in evaluation of allergic rhinoconjunctivitis and asthma.  I last saw her in this clinic on 16 March 2021.  She has really done well with her asthma while using Advair mostly 1 time per day and rarely using a short acting bronchodilator no need to use a systemic steroid to treat an exacerbation.  She has really had very little problems with her nose while using Flonase.  She has not required an antibiotic to treat an episode of sinusitis.  She continues on immunotherapy currently at every 4 weeks with very good response and no adverse effects.  She has received this years flu vaccine.  This summer she had her 58 year old dad moved in with her from [place].  Allergies as of 05/24/2022       Reactions   Azithromycin Hives   Clarithromycin Hives   Macrolides And Ketolides Hives   Medroxyprogesterone Other (See Comments)   Mono like sx, fatigue, nausea, vomiting Mono like sx, fatigue, nausea, vomiting Mono like sx, fatigue, nausea, vomiting Mono like sx, fatigue, nausea, vomiting   Rizatriptan Palpitations        Medication List    albuterol 108 (90 Base) MCG/ACT inhaler Commonly known as: VENTOLIN HFA Inhale into the lungs.   ALPRAZolam 0.25 MG tablet Commonly known as: XANAX Take 0.25 mg by mouth 3 (three) times daily as needed.   fluticasone 50 MCG/ACT nasal spray Commonly known as: FLONASE 1-2 sprays in each nostril during upper airway symptoms   Fluticasone-Salmeterol 100-50 MCG/DOSE Aepb Commonly known as: ADVAIR   meloxicam 15 MG tablet Commonly known as:  Mobic Take 1 tablet (15 mg total) by mouth daily.   montelukast 10 MG tablet Commonly known as: SINGULAIR Take 10 mg by mouth every morning.   multivitamin capsule Take 1 capsule by mouth daily.   omeprazole 20 MG tablet Commonly known as: PRILOSEC OTC Take by mouth.   ondansetron 4 MG disintegrating tablet Commonly known as: ZOFRAN-ODT Take 1 tablet (4 mg total) by mouth every 8 (eight) hours as needed for nausea or vomiting.   promethazine 25 MG tablet Commonly known as: PHENERGAN Take 25 mg by mouth every 6 (six) hours as needed.   sertraline 50 MG tablet Commonly known as: ZOLOFT take 1.5 tablet oral daily   traZODone 50 MG tablet Commonly known as: DESYREL Take 50-100 mg by mouth at bedtime.   zolpidem 5 MG tablet Commonly known as: AMBIEN Take 5 mg by mouth at bedtime as needed.    Past Medical History:  Diagnosis Date   Asthma     Past Surgical History:  Procedure Laterality Date   CARPAL TUNNEL RELEASE Bilateral    TUBAL LIGATION      Review of systems negative except as noted in HPI / PMHx or noted below:  Review of Systems  Constitutional: Negative.   HENT: Negative.    Eyes: Negative.   Respiratory: Negative.    Cardiovascular: Negative.   Gastrointestinal: Negative.   Genitourinary: Negative.   Musculoskeletal: Negative.   Skin: Negative.   Neurological: Negative.   Endo/Heme/Allergies: Negative.   Psychiatric/Behavioral: Negative.  Objective:   Vitals:   05/24/22 0900  BP: 134/82  Pulse: 80  Resp: 18  Temp: 98 F (36.7 C)  SpO2: 96%   Height: 5\' 6"  (167.6 cm)      Physical Exam Constitutional:      Appearance: She is not diaphoretic.  HENT:     Head: Normocephalic.     Right Ear: Tympanic membrane, ear canal and external ear normal.     Left Ear: Tympanic membrane, ear canal and external ear normal.     Nose: Nose normal. No mucosal edema or rhinorrhea.     Mouth/Throat:     Pharynx: Uvula midline. No  oropharyngeal exudate.  Eyes:     Conjunctiva/sclera: Conjunctivae normal.  Neck:     Thyroid: No thyromegaly.     Trachea: Trachea normal. No tracheal tenderness or tracheal deviation.  Cardiovascular:     Rate and Rhythm: Normal rate and regular rhythm.     Heart sounds: Normal heart sounds, S1 normal and S2 normal. No murmur heard. Pulmonary:     Effort: No respiratory distress.     Breath sounds: Normal breath sounds. No stridor. No wheezing or rales.  Lymphadenopathy:     Head:     Right side of head: No tonsillar adenopathy.     Left side of head: No tonsillar adenopathy.     Cervical: No cervical adenopathy.  Skin:    Findings: No erythema or rash.     Nails: There is no clubbing.  Neurological:     Mental Status: She is alert.     Diagnostics:    Spirometry was performed and demonstrated an FEV1 of 2.52 at 85 % of predicted.  Assessment and Plan:   1. Asthma, moderate persistent, well-controlled   2. Seasonal and perennial allergic rhinitis   3. Allergic rhinitis, unspecified seasonality, unspecified trigger    1.  Continue Advair 100 - 1 inhalation 1-2 times per day depending on disease activity  2.  Continue OTC Flonase - 1-2 sprays each nostril daily during periods of upper airway symptoms  3. Continue immunotherapy   4. If needed:   A. Antihistamine  B. Nasal saline  C. Albuterol HFA  5. Return to clinic in 1 year or earlier if problem   Shelia Bowers appears to be doing quite well on her current therapy and I think it would be worthwhile for her to continue on immunotherapy at this point in time as well as at her anti-inflammatory medications for her airway including use of Advair and Flonase.  I will see her back in this clinic earlier if there is  , MD Allergy / Immunology [facility]

## 2022-05-25 ENCOUNTER — Encounter: Payer: Self-pay | Admitting: Allergy and Immunology

## 2022-06-04 IMAGING — CT CT ABD-PELV W/ CM
2 of 5 series · 15 of 46 positions shown, 17 images · IV contrast (APPLIED)
Comparison: None.

CLINICAL DATA: Abdominal pain

EXAM:
CT ABDOMEN AND PELVIS WITH CONTRAST
TECHNIQUE: Multidetector CT imaging of the abdomen and pelvis was performed
using the standard protocol following bolus administration of
intravenous contrast.

[Series 2: abdomen 5.0 · axial · 0.98mm/px · z∈[-1158,-718]mm · 12 of 106 slices shown, 14 images]
[im 9/106  soft-tissue]
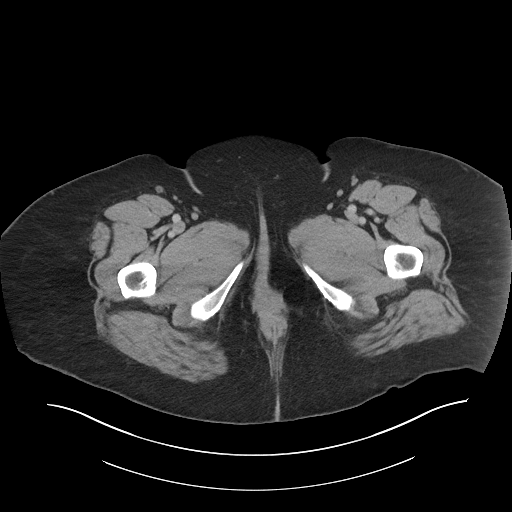
[im 9/106  bone]
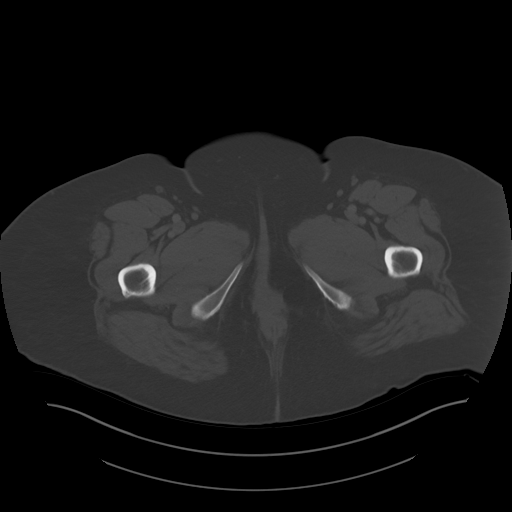
[im 17/106  soft-tissue]
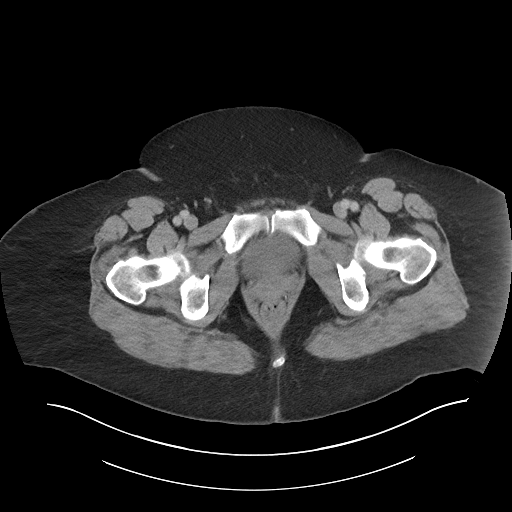
[im 25/106  soft-tissue]
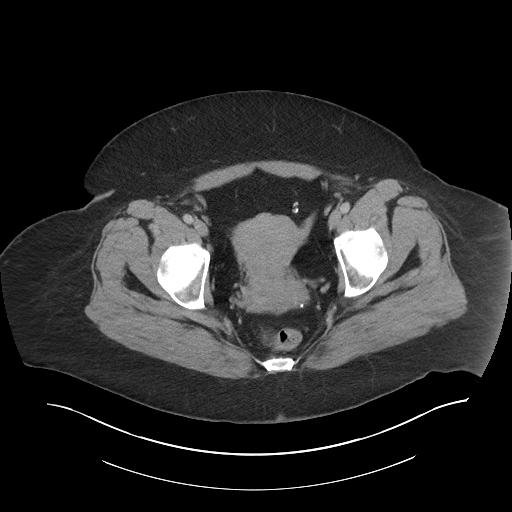
[im 33/106  soft-tissue]
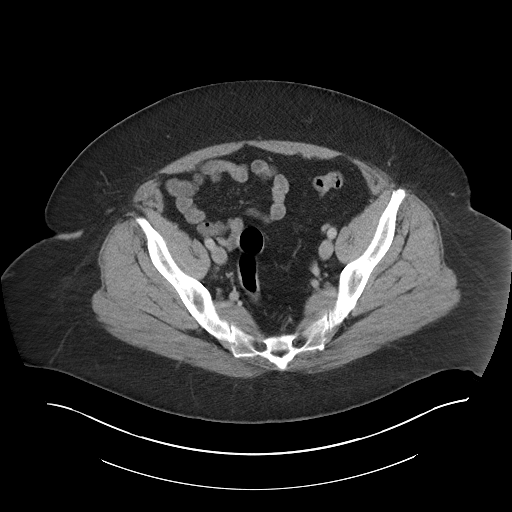
[im 41/106  soft-tissue]
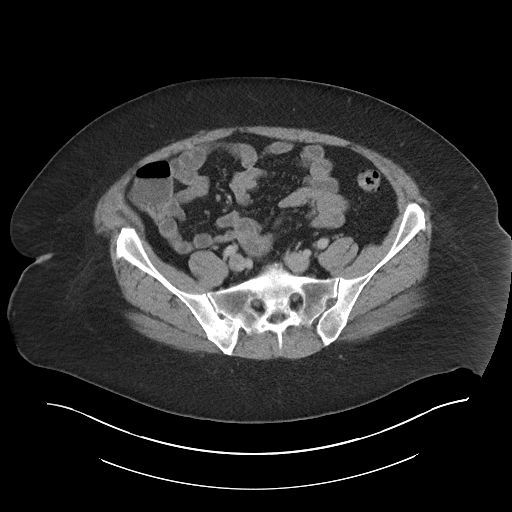
[im 49/106  soft-tissue]
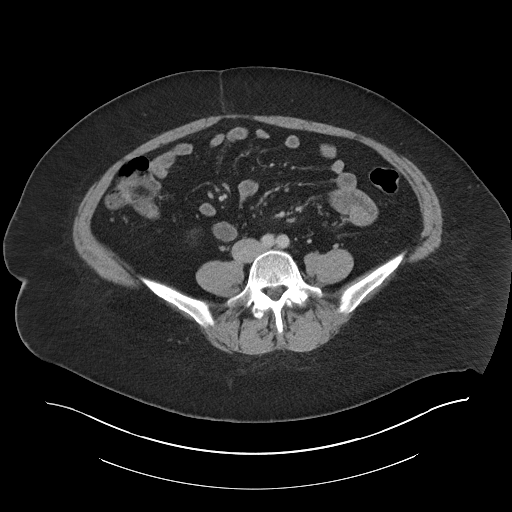
[im 57/106  soft-tissue]
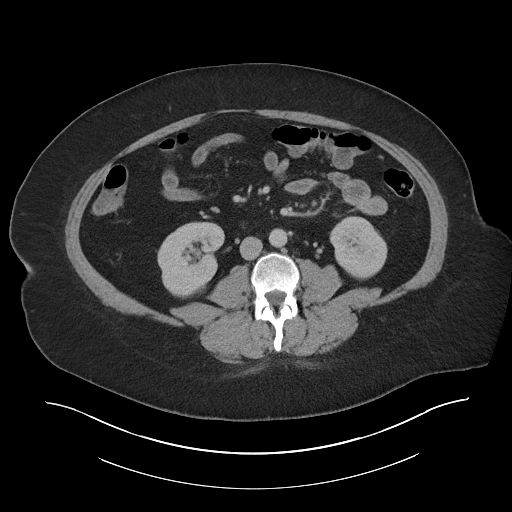
[im 65/106  soft-tissue]
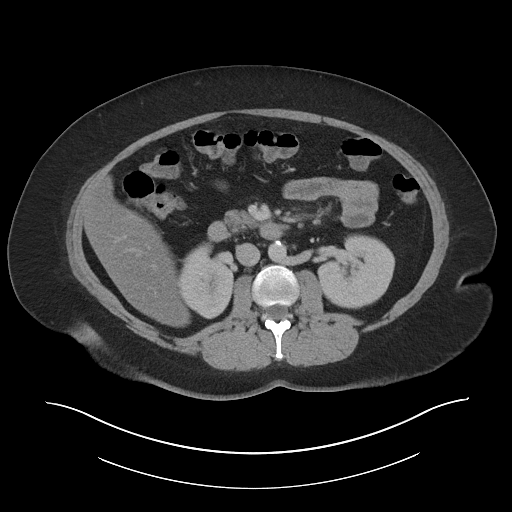
[im 73/106  soft-tissue]
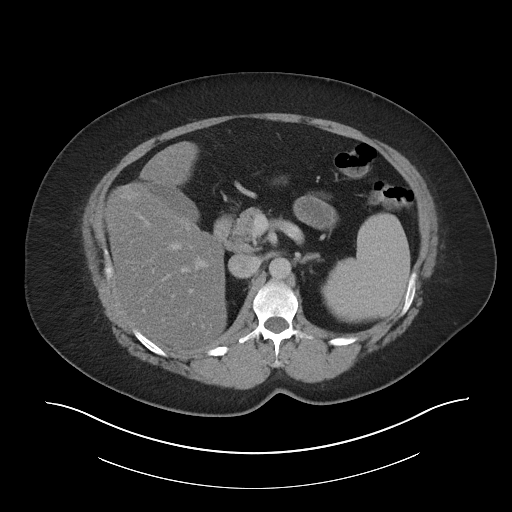
[im 73/106  bone]
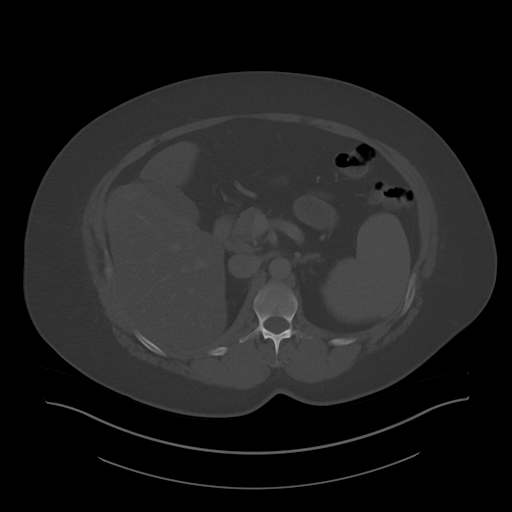
[im 81/106  soft-tissue]
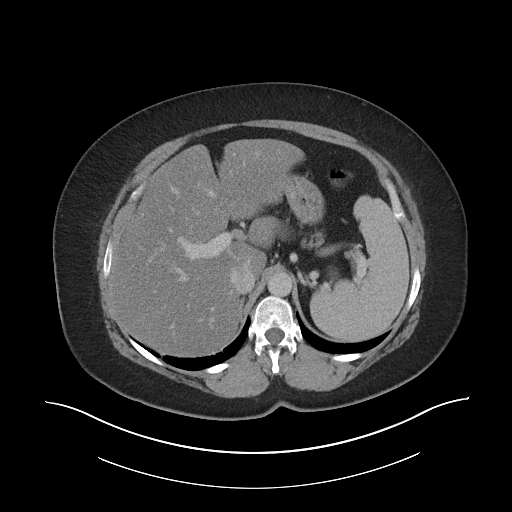
[im 89/106  soft-tissue]
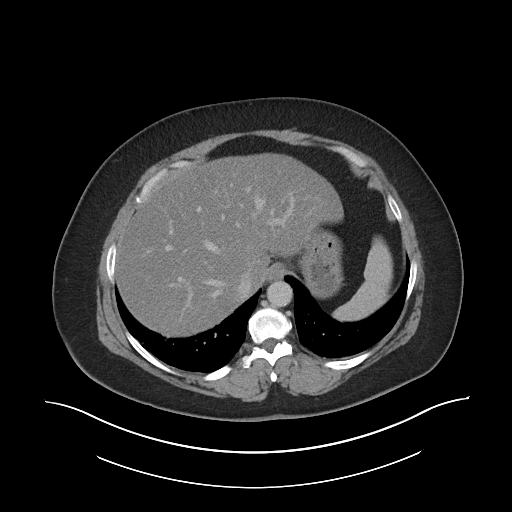
[im 97/106  soft-tissue]
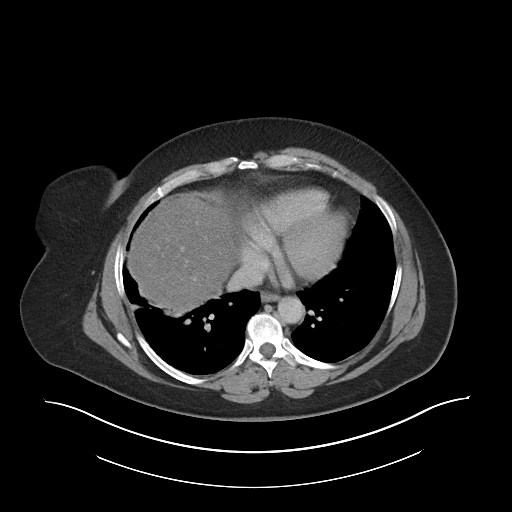

[Series 4: abdomen 3.0 mpr cor · coronal · 0.97mm/px · 3 of 119 slices shown]
[im 40/119  soft-tissue]
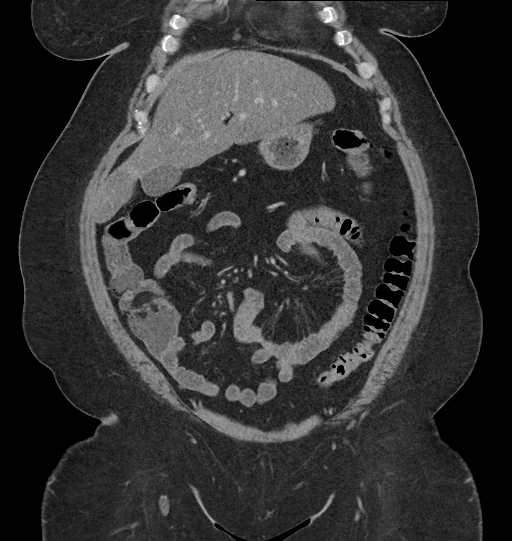
[im 53/119  soft-tissue]
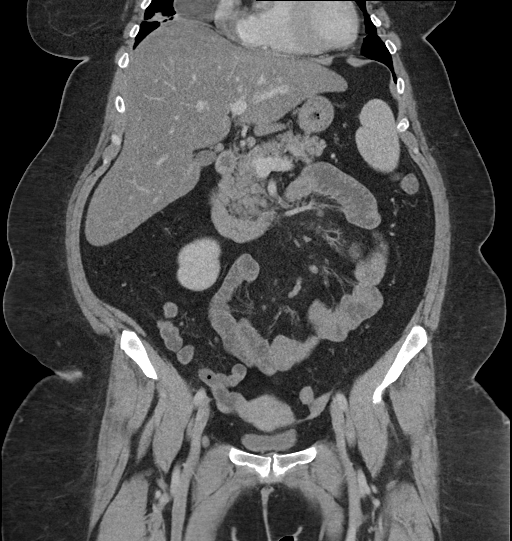
[im 66/119  soft-tissue]
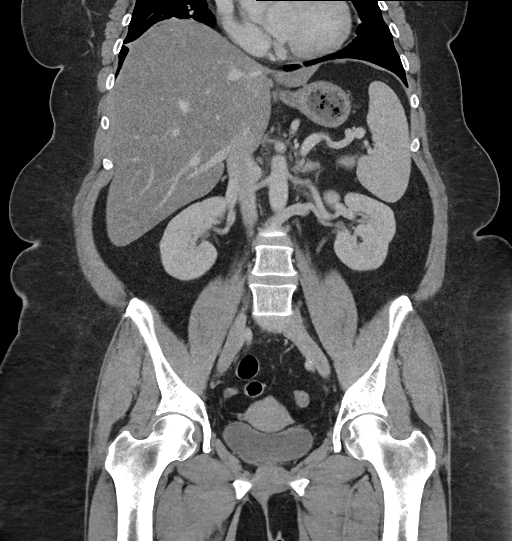

[15 of 46 positions shown; findings below may reference images not displayed]

RADIATION DOSE REDUCTION: This exam was performed according to the
departmental dose-optimization program which includes automated
exposure control, adjustment of the mA and/or kV according to
patient size and/or use of iterative reconstruction technique.

CONTRAST:  100mL OMNIPAQUE IOHEXOL 300 MG/ML  SOLN
FINDINGS: Lower chest: The there is a well-circumscribed, nonaggressive
appearing fluid density mass adjacent overlying the right middle
lobe abutting the right-side of heart measuring 6.7 x 4.5 cm, image
[DATE]. Surrounding subsegmental atelectasis is noted.

Hepatobiliary: There is subjective hepatic steatosis. No focal
suspicious liver abnormality. Gallbladder is normal. No bile duct
dilatation.

Pancreas: Unremarkable. No pancreatic ductal dilatation or
surrounding inflammatory changes.

Spleen: Normal in size without focal abnormality.

Adrenals/Urinary Tract: Normal adrenal glands. No nephrolithiasis,
hydronephrosis or mass. Bladder is unremarkable.

Stomach/Bowel: Stomach is within normal limits. Appendix appears
normal. No evidence of bowel wall thickening, distention, or
inflammatory changes.

Vascular/Lymphatic: Aortic atherosclerosis. No aneurysm. No
abdominopelvic adenopathy.

Reproductive: Uterus and bilateral adnexa are unremarkable.

Other: No ascites or fluid collections. Small fat containing
umbilical hernia. No signs of pneumoperitoneum.

Musculoskeletal: No acute or significant osseous findings.
IMPRESSION: 1. No acute findings within the abdomen or pelvis.
2. Hepatic steatosis.
3. Benign-appearing fluid density mass adjacent to the right middle
lobe abutting the right-side of heart measuring 6.7 x 4.5 cm. Favor
a benign etiology such as a pericardial or bronchogenic cyst.
4. Small fat containing umbilical hernia.
5. Aortic Atherosclerosis ([id number]).

## 2022-06-04 IMAGING — US US ABDOMEN LIMITED
1 series · 14 of 25 positions shown · non-contrast
Comparison: CT 10/20/2021, ultrasound 10/26/2015

CLINICAL DATA: Elevated LFT

EXAM:
ULTRASOUND ABDOMEN LIMITED RIGHT UPPER QUADRANT

[Series 1: us abdomen limited ruq (liver/gb) · 14 of 42 slices shown]
[im 1/42]
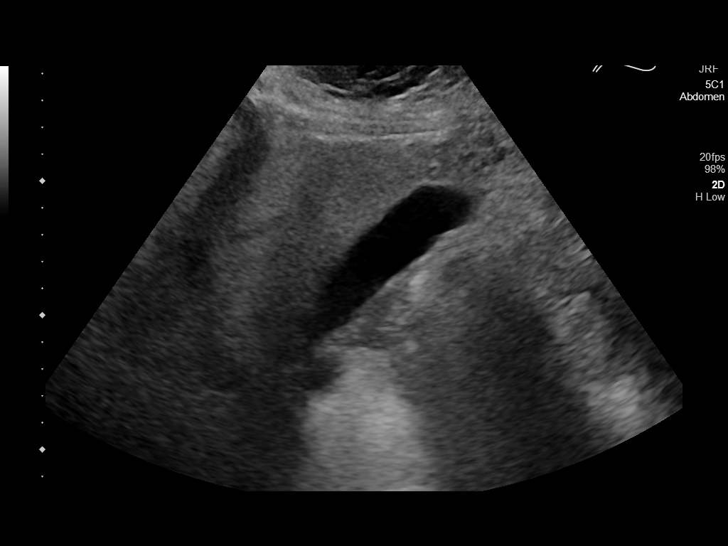
[im 4/42]
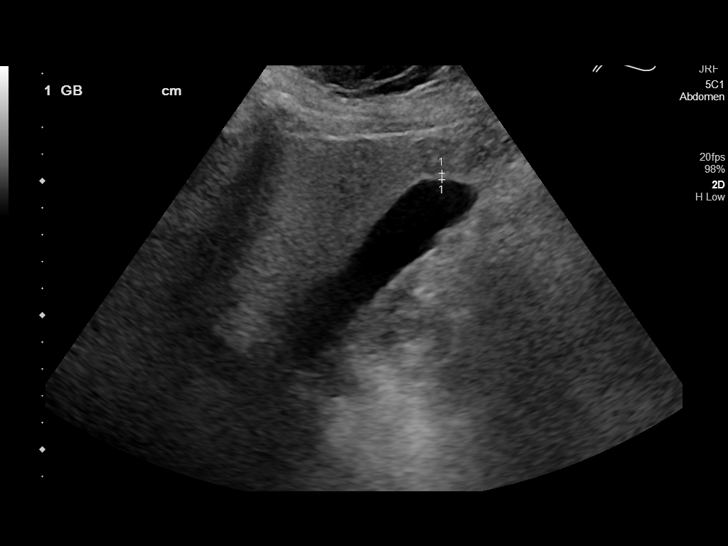
[im 7/42]
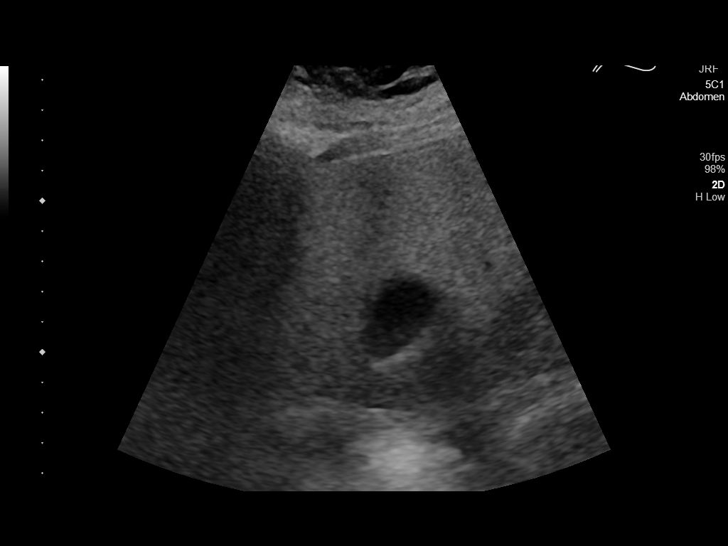
[im 11/42]
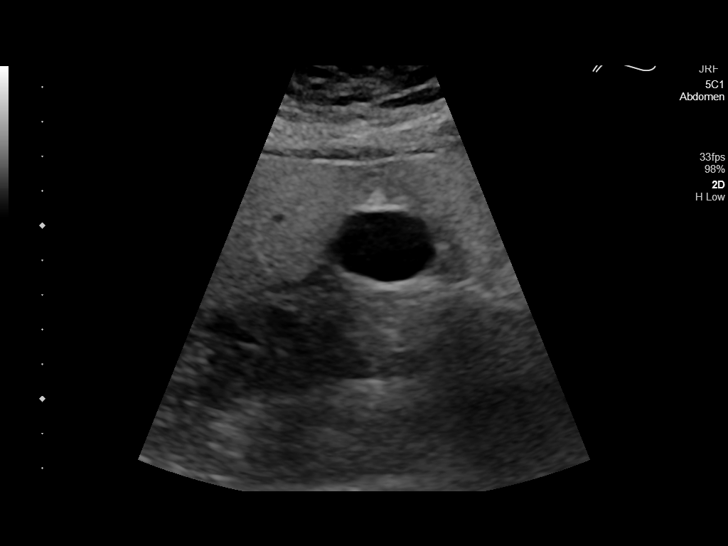
[im 14/42]
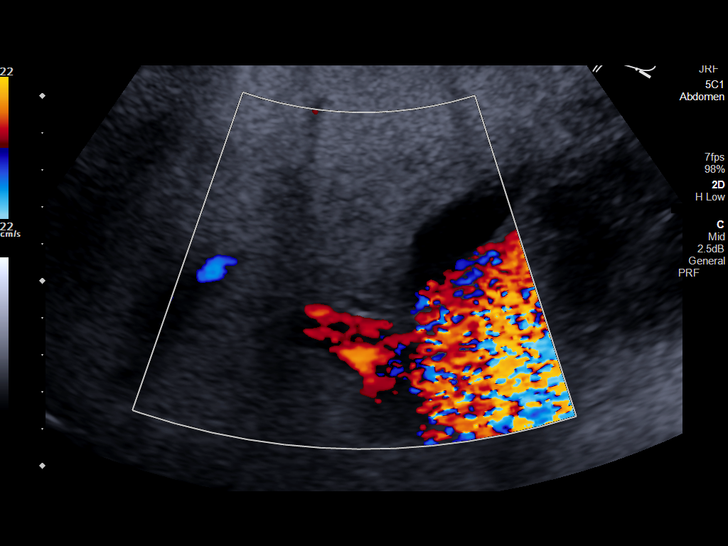
[im 16/42]
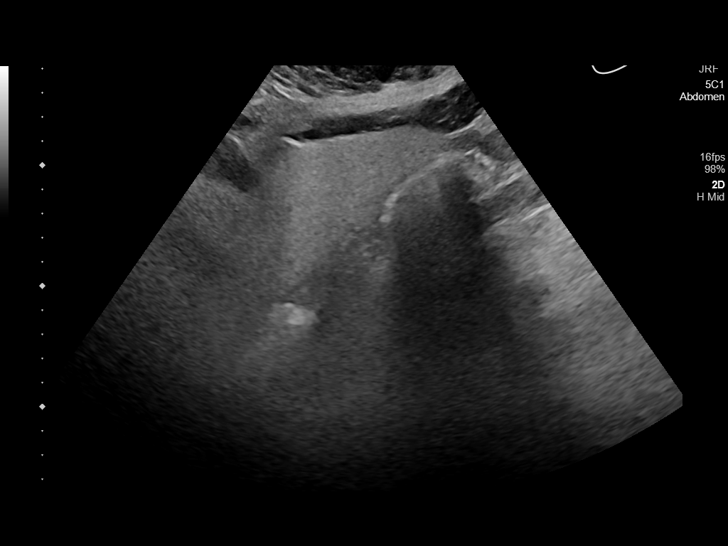
[im 19/42]
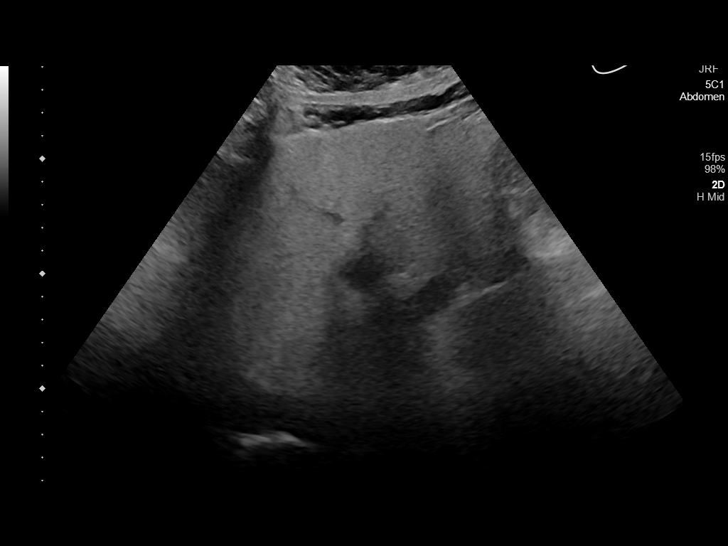
[im 23/42]
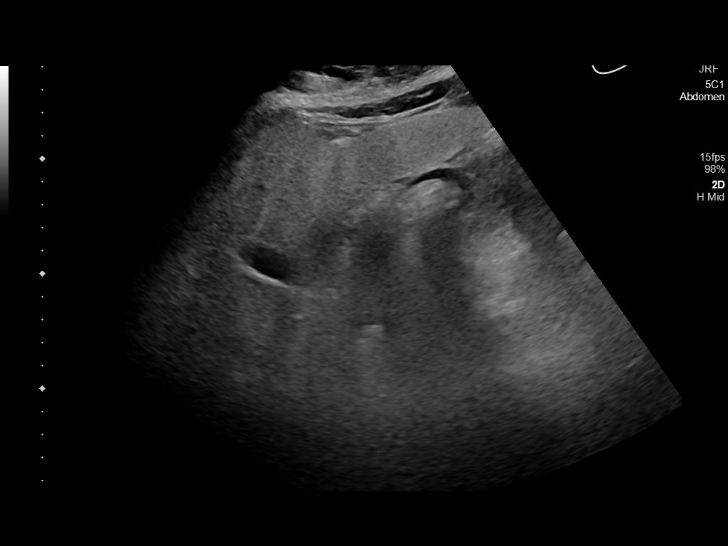
[im 26/42]
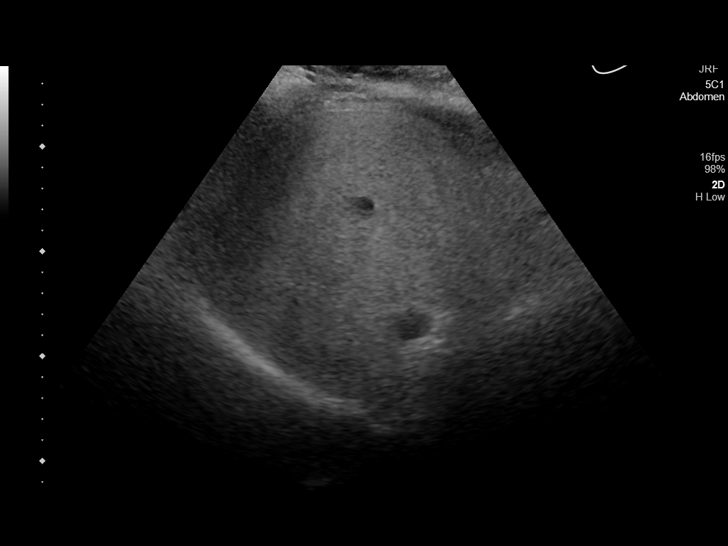
[im 28/42]
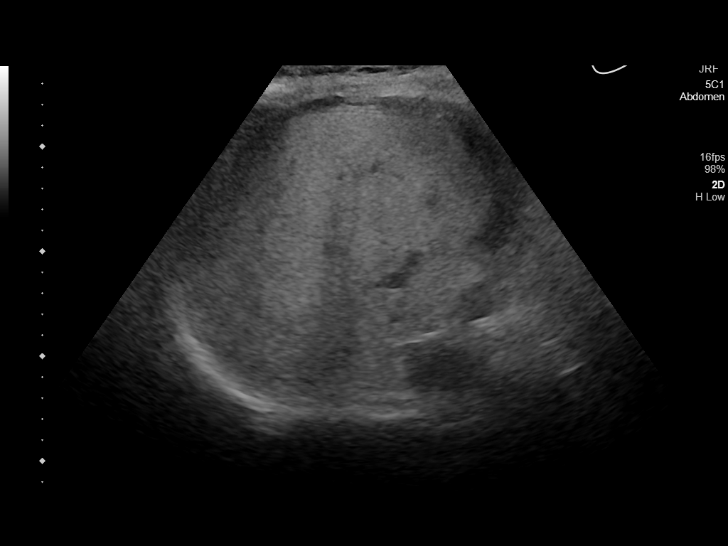
[im 31/42]
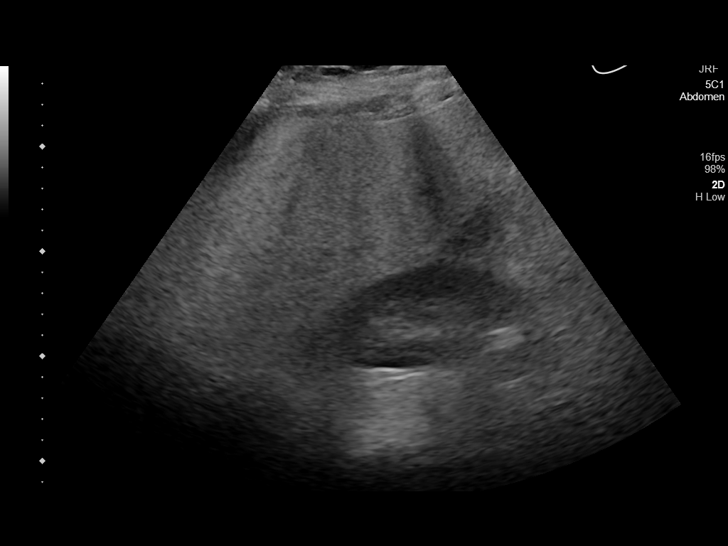
[im 35/42]
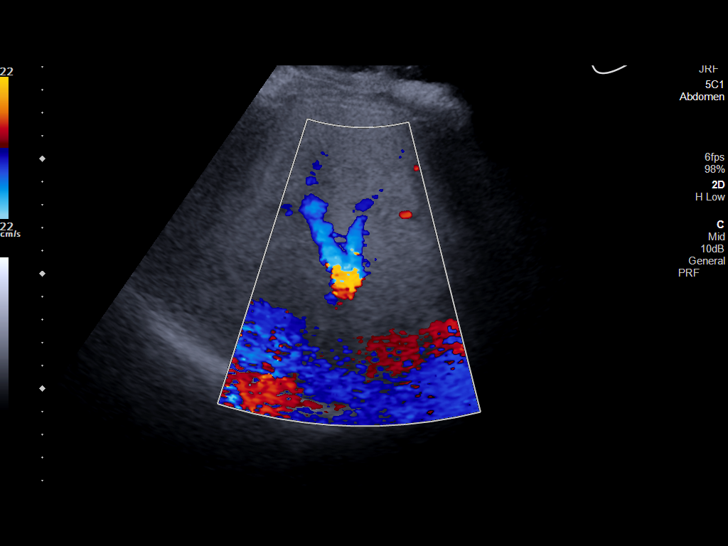
[im 38/42]
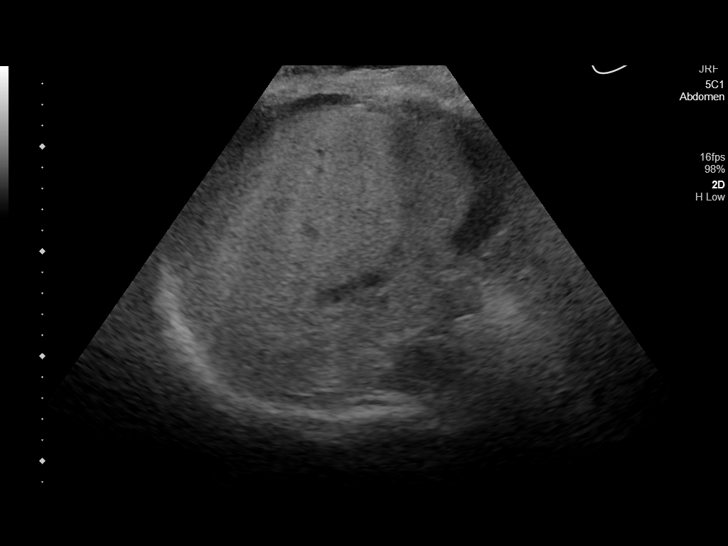
[im 42/42]
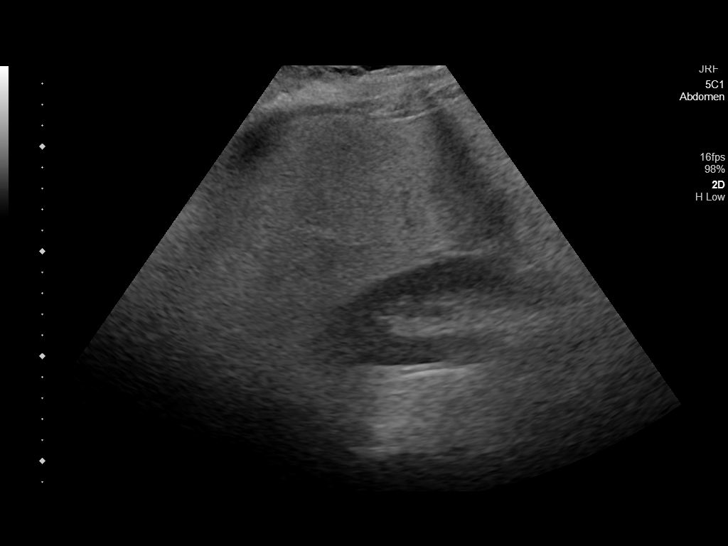

[14 of 25 positions shown; findings below may reference images not displayed]

FINDINGS: Gallbladder:

No gallstones or wall thickening visualized. No sonographic Murphy
sign noted by sonographer.

Common bile duct:

Diameter: 2.9 mm

Liver:

Diffusely echogenic. No focal hepatic abnormality. Portal vein is
patent on color Doppler imaging with normal direction of blood flow
towards the liver.

Other: None.
IMPRESSION: 1. Echogenic liver parenchyma consistent with hepatic steatosis and
or hepatocellular disease.
2. Otherwise negative abdominal ultrasound

## 2022-06-22 ENCOUNTER — Ambulatory Visit (INDEPENDENT_AMBULATORY_CARE_PROVIDER_SITE_OTHER): Payer: 59

## 2022-06-22 DIAGNOSIS — J309 Allergic rhinitis, unspecified: Secondary | ICD-10-CM

## 2022-07-20 ENCOUNTER — Ambulatory Visit (INDEPENDENT_AMBULATORY_CARE_PROVIDER_SITE_OTHER): Payer: 59

## 2022-07-20 DIAGNOSIS — J309 Allergic rhinitis, unspecified: Secondary | ICD-10-CM

## 2022-08-12 ENCOUNTER — Telehealth: Payer: Self-pay | Admitting: *Deleted

## 2022-08-12 NOTE — Telephone Encounter (Signed)
Patients allergy vials from [facility] are running low, I called the patient and advised that when she come in to sign the refill form in the Out of office binder and we will fax that along with her shot records to the office. Patient verbalized understanding and stated that she will be in next week for her injections and will sign the form then.

## 2022-08-17 ENCOUNTER — Ambulatory Visit (INDEPENDENT_AMBULATORY_CARE_PROVIDER_SITE_OTHER): Payer: 59

## 2022-08-17 DIAGNOSIS — J309 Allergic rhinitis, unspecified: Secondary | ICD-10-CM

## 2022-09-16 ENCOUNTER — Ambulatory Visit (INDEPENDENT_AMBULATORY_CARE_PROVIDER_SITE_OTHER): Payer: 59

## 2022-09-16 DIAGNOSIS — J309 Allergic rhinitis, unspecified: Secondary | ICD-10-CM

## 2022-09-16 NOTE — Telephone Encounter (Signed)
Forms have been signed by patient and faxed to the attention of Debbie at [facility] to [phone] to re-order allergy serum.

## 2022-09-19 NOTE — Telephone Encounter (Signed)
Called and spoke with Jackelyn Poling at [facility] and she stated that she did receive the fax request and she is going to go ahead and order her serum today. She stated that she will call with an update when they are ready.

## 2022-09-29 NOTE — Telephone Encounter (Signed)
Called and spoke with Debbi from [facility], she stated that she should be getting the patients vials this afternoon or on Monday and then will mail them out to [place].

## 2022-09-30 ENCOUNTER — Telehealth: Payer: Self-pay

## 2022-09-30 NOTE — Telephone Encounter (Signed)
I called the patient to inform her her allergy shots had been delivered by [facility]. Her red vials have been placed in the fridge and paper work has been placed in the red folder.

## 2022-10-07 NOTE — Telephone Encounter (Signed)
Will call and follow up Monday with Gavin Pound to inquire when [name]'s vials will be mailed to [place].

## 2022-10-10 NOTE — Telephone Encounter (Signed)
Vials have been delivered to our office.

## 2022-10-13 ENCOUNTER — Ambulatory Visit (INDEPENDENT_AMBULATORY_CARE_PROVIDER_SITE_OTHER): Payer: 59

## 2022-10-13 DIAGNOSIS — J309 Allergic rhinitis, unspecified: Secondary | ICD-10-CM

## 2022-11-01 ENCOUNTER — Ambulatory Visit (INDEPENDENT_AMBULATORY_CARE_PROVIDER_SITE_OTHER): Payer: 59

## 2022-11-01 DIAGNOSIS — J309 Allergic rhinitis, unspecified: Secondary | ICD-10-CM

## 2022-11-22 ENCOUNTER — Ambulatory Visit (INDEPENDENT_AMBULATORY_CARE_PROVIDER_SITE_OTHER): Payer: 59 | Admitting: *Deleted

## 2022-11-22 DIAGNOSIS — J309 Allergic rhinitis, unspecified: Secondary | ICD-10-CM

## 2022-12-14 ENCOUNTER — Ambulatory Visit (INDEPENDENT_AMBULATORY_CARE_PROVIDER_SITE_OTHER): Payer: 59

## 2022-12-14 DIAGNOSIS — J309 Allergic rhinitis, unspecified: Secondary | ICD-10-CM

## 2023-01-11 ENCOUNTER — Ambulatory Visit (INDEPENDENT_AMBULATORY_CARE_PROVIDER_SITE_OTHER): Payer: 59 | Admitting: *Deleted

## 2023-01-11 DIAGNOSIS — J309 Allergic rhinitis, unspecified: Secondary | ICD-10-CM

## 2023-02-13 ENCOUNTER — Ambulatory Visit (INDEPENDENT_AMBULATORY_CARE_PROVIDER_SITE_OTHER): Payer: Self-pay

## 2023-02-13 DIAGNOSIS — J309 Allergic rhinitis, unspecified: Secondary | ICD-10-CM

## 2023-03-16 ENCOUNTER — Ambulatory Visit (INDEPENDENT_AMBULATORY_CARE_PROVIDER_SITE_OTHER): Payer: 59

## 2023-03-16 DIAGNOSIS — J309 Allergic rhinitis, unspecified: Secondary | ICD-10-CM

## 2023-03-29 ENCOUNTER — Encounter: Payer: Self-pay | Admitting: Internal Medicine

## 2023-03-29 ENCOUNTER — Other Ambulatory Visit: Payer: Self-pay

## 2023-03-29 ENCOUNTER — Ambulatory Visit (INDEPENDENT_AMBULATORY_CARE_PROVIDER_SITE_OTHER): Payer: 59 | Admitting: Internal Medicine

## 2023-03-29 VITALS — BP 160/102 | HR 93 | Temp 98.7°F | Resp 18 | Ht 66.0 in | Wt 300.9 lb

## 2023-03-29 DIAGNOSIS — J302 Other seasonal allergic rhinitis: Secondary | ICD-10-CM

## 2023-03-29 DIAGNOSIS — J4541 Moderate persistent asthma with (acute) exacerbation: Secondary | ICD-10-CM

## 2023-03-29 DIAGNOSIS — J3089 Other allergic rhinitis: Secondary | ICD-10-CM

## 2023-03-29 MED ORDER — FEXOFENADINE HCL 180 MG PO TABS: 180.0000 mg | ORAL_TABLET | Freq: Every day | ORAL | 5 refills | Status: AC

## 2023-03-29 MED ORDER — ALBUTEROL SULFATE HFA 108 (90 BASE) MCG/ACT IN AERS: 2.0000 | INHALATION_SPRAY | RESPIRATORY_TRACT | 1 refills | Status: DC | PRN

## 2023-03-29 MED ORDER — PREDNISONE 10 MG PO TABS: ORAL_TABLET | ORAL | 0 refills | Status: AC

## 2023-03-29 NOTE — Patient Instructions (Addendum)
Start Prednisone 40mg  x1 day, 20mg  x2 days and 10mg  x 2 days.    1.  Continue Advair 100-44mcg 1 inhalation twice daily.   2.  Continue OTC Flonase - 2 sprays each nostril daily  3. Continue immunotherapy- currently on monthly schedule   4. If needed (NOW)   A. Allegra 180mg  daily as needed  B. Nasal saline   C. Albuterol HFA 1-2 puffs every 4-6 hours as needed for wheezing/shortness of breath    Follow up with Dr Lucie Leather as scheduled in November 2024.

## 2023-03-29 NOTE — Progress Notes (Signed)
   FOLLOW UP Date of Service/Encounter:  03/29/23   Subjective:  Shelia Bowers (DOB: 10/17/1970) is a 52 y.o. female who [facility] for follow up for an acute visit.   History obtained from: chart review and patient. Last visit was with Dr. Lucie Leather 05/24/2022 for asthma, allergic rhinitis on Advair, Flonase, AIT.  Reports testing positive for COVID on 9/6 and then negative about a week later.  Did have some respiratory issues with cough, shortness of breath but this has persisted.  Still reports being short of breath easily, cough with mucous and also with wheezing. Wheezing is worse at nighttime.  Using Advair 1 puff BID and Albuterol PRN with minimal relief.  Saw [facility] who prescribed 7 days of levaquin which she is about to complete in a day.  Also has noted increased congestion, mucoid drainage. On AIT and doing monthly shots.  Has started Mucinex D and using Flonase daily. Not doing saline rinses or Xyzal as the Xyzal makes her very sleepy.    Past Medical History: Past Medical History:  Diagnosis Date   Asthma     Objective:  BP (!) 160/102 (BP Location: Left Arm, Patient Position: Sitting, Cuff Size: Large) Comment: Patient stated she does not take BP meds  Pulse 93   Temp 98.7 F (37.1 C) (Temporal)   Resp 18   Ht 5\' 6"  (1.676 m)   Wt (!) 300 lb 14.4 oz (136.5 kg)   SpO2 96%   BMI 48.57 kg/m  Body mass index is 48.57 kg/m. Physical Exam: GEN: alert, well developed HEENT: clear conjunctiva,  nose with mild inferior turbinate hypertrophy, pink  nasal mucosa, clear rhinorrhea, + cobblestoning HEART: regular rate and rhythm, no murmur LUNGS:  unlabored respiration, + diffuse expiratory wheezing  SKIN: no rashes or lesions  Spirometry:  Tracings reviewed. Her effort: Good reproducible efforts. FVC: 2.79L FEV1: 2.39L, 83% predicted FEV1/FVC ratio: 86% Interpretation: Spirometry consistent with normal pattern.  Please see  scanned spirometry results for details.   Assessment:   1. Moderate persistent asthma with acute exacerbation   2. Seasonal and perennial allergic rhinitis     Plan/Recommendations:  Overall has done very well in terms of her asthma and allergies.  Did have a flare up today with wheezing on exam, although normal spirometry. This was post COVID.  She has not had an asthma flare up in years nor required oral prednisone.  Will start short course of prednisone and discussed for now continuing same dose of Advair.  Will switch Xyzal as it causes her to be very sleepy/lethargic.   Start Prednisone 40mg  x1 day, 20mg  x2 days and 10mg  x 2 days.    1.  Continue Advair 100-55mcg 1 inhalation twice daily.   2.  Continue OTC Flonase - 2 sprays each nostril daily  3. Continue immunotherapy- currently on monthly schedule   4. If needed (NOW)   A. Allegra 180mg  daily as needed  B. Nasal saline   C. Albuterol HFA 1-2 puffs every 4-6 hours as needed for wheezing/shortness of breath    Follow up with Dr Lucie Leather as scheduled in November 2024.     No follow-ups on file.  Alesia Morin, MD [facility]

## 2023-04-12 ENCOUNTER — Ambulatory Visit (INDEPENDENT_AMBULATORY_CARE_PROVIDER_SITE_OTHER): Payer: 59 | Admitting: *Deleted

## 2023-04-12 DIAGNOSIS — J309 Allergic rhinitis, unspecified: Secondary | ICD-10-CM | POA: Diagnosis not present

## 2023-05-16 ENCOUNTER — Telehealth: Payer: Self-pay | Admitting: *Deleted

## 2023-05-16 ENCOUNTER — Other Ambulatory Visit: Payer: Self-pay

## 2023-05-16 ENCOUNTER — Encounter: Payer: Self-pay | Admitting: Allergy and Immunology

## 2023-05-16 ENCOUNTER — Ambulatory Visit (INDEPENDENT_AMBULATORY_CARE_PROVIDER_SITE_OTHER): Payer: 59 | Admitting: *Deleted

## 2023-05-16 ENCOUNTER — Ambulatory Visit (INDEPENDENT_AMBULATORY_CARE_PROVIDER_SITE_OTHER): Payer: 59 | Admitting: Allergy and Immunology

## 2023-05-16 VITALS — BP 132/82 | HR 99 | Temp 98.3°F | Resp 18 | Ht 66.0 in | Wt 299.7 lb

## 2023-05-16 DIAGNOSIS — J301 Allergic rhinitis due to pollen: Secondary | ICD-10-CM | POA: Diagnosis not present

## 2023-05-16 DIAGNOSIS — J4540 Moderate persistent asthma, uncomplicated: Secondary | ICD-10-CM

## 2023-05-16 DIAGNOSIS — K219 Gastro-esophageal reflux disease without esophagitis: Secondary | ICD-10-CM | POA: Diagnosis not present

## 2023-05-16 DIAGNOSIS — J309 Allergic rhinitis, unspecified: Secondary | ICD-10-CM

## 2023-05-16 DIAGNOSIS — J3089 Other allergic rhinitis: Secondary | ICD-10-CM

## 2023-05-16 MED ORDER — FLUTICASONE PROPIONATE 50 MCG/ACT NA SUSP: 2.0000 | Freq: Every day | NASAL | 1 refills | Status: DC

## 2023-05-16 MED ORDER — AIRSUPRA 90-80 MCG/ACT IN AERO: 2.0000 | INHALATION_SPRAY | RESPIRATORY_TRACT | 1 refills | Status: DC | PRN

## 2023-05-16 MED ORDER — TRELEGY ELLIPTA 100-62.5-25 MCG/ACT IN AEPB: 1.0000 | INHALATION_SPRAY | Freq: Every morning | RESPIRATORY_TRACT | 1 refills | Status: DC

## 2023-05-16 MED ORDER — OMEPRAZOLE 40 MG PO CPDR: 40.0000 mg | DELAYED_RELEASE_CAPSULE | Freq: Two times a day (BID) | ORAL | 1 refills | Status: DC

## 2023-05-16 NOTE — Progress Notes (Unsigned)
[place] - [place] - [place] - [place] - [place]   Follow-up Note  Referring Provider: Dortha Kern, MD Primary Provider: Dortha Kern, MD Date of Office Visit: 05/16/2023  Subjective:   Shelia Bowers (DOB: 09/26/1970) is a 52 y.o. female who [facility] on 05[facility]16/2023 in re-evaluation of the following:  HPI: Shelia Bowers returns to this clinic in evaluation of allergic rhinoconjunctivitis and asthma.  I last saw her in this clinic 24 May 2022 and she visited with Dr. Allena Katz on 29 March 2023.  When she was last seen in this clinic by Dr. Allena Katz on 29 March 2023 she was having evaluation for what appeared to be a post-COVID respiratory tract issue having sustained COVID on 03 March 2023 with continued breathing problems including cough and mucus and wheezing.  She apparently was given Levaquin by a telemetry doc and a course of prednisone by Dr. Allena Katz.  Although she is better she still has "wheezing" and it appears as though most of her wheezing is in her throat and she is having a lot of throat clearing and she feels as though there is mucus in her throat.  She does have reflux disease and she is using omeprazole 20 mg at evening as she feels as though her classic reflux symptoms are controlled.  It should be noted that she had significant coughing during her COVID episode.  She has very little issues with her upper airways.  She uses some nasal steroid and she has been using immunotherapy currently at every 4 weeks without any adverse effect which is resulted in significant improvement regarding her allergies.  Allergies as of 05/16/2023       Reactions   Azithromycin Hives   Clarithromycin Hives   Macrolides And Ketolides Hives   Medroxyprogesterone Other (See Comments)   Mono like sx, fatigue, nausea, vomiting Mono like sx, fatigue, nausea, vomiting Mono like sx, fatigue, nausea, vomiting Mono like sx, fatigue, nausea, vomiting    Rizatriptan Palpitations        Medication List    albuterol 108 (90 Base) MCG/ACT inhaler Commonly known as: VENTOLIN HFA Inhale 2 puffs into the lungs every 4 (four) hours as needed for wheezing or shortness of breath.   fexofenadine 180 MG tablet Commonly known as: Allegra Allergy Take 1 tablet (180 mg total) by mouth daily.   fluticasone 50 MCG/ACT nasal spray Commonly known as: FLONASE 1-2 sprays in each nostril during upper airway symptoms   fluticasone-salmeterol 100-50 MCG/ACT Aepb Commonly known as: ADVAIR Inhale 1 puff into the lungs 2 (two) times daily.   levocetirizine 5 MG tablet Commonly known as: XYZAL Take 1 tablet (5 mg total) by mouth daily as needed for allergies (Can take an extra dose during flare ups.).   multivitamin capsule Take 1 capsule by mouth daily.   omeprazole 20 MG tablet Commonly known as: PRILOSEC OTC Take by mouth.   sertraline 50 MG tablet Commonly known as: ZOLOFT take 1.5 tablet oral daily    Past Medical History:  Diagnosis Date   Asthma     Past Surgical History:  Procedure Laterality Date   CARPAL TUNNEL RELEASE Bilateral    TUBAL LIGATION      Review of systems negative except as noted in HPI / PMHx or noted below:  Review of Systems  Constitutional: Negative.   HENT: Negative.    Eyes: Negative.   Respiratory: Negative.    Cardiovascular: Negative.   Gastrointestinal: Negative.  Genitourinary: Negative.   Musculoskeletal: Negative.   Skin: Negative.   Neurological: Negative.   Endo/Heme/Allergies: Negative.   Psychiatric/Behavioral: Negative.       Objective:   Vitals:   05/16/23 0901  BP: 132/82  Pulse: 99  Temp: 98.3 F (36.8 C)  SpO2: 98%      Weight: 299 lb 11.2 oz (135.9 kg)   Physical Exam Constitutional:      Appearance: She is not diaphoretic.     Comments: Incessant throat clearing  HENT:     Head: Normocephalic.     Right Ear: Tympanic membrane, ear canal and external ear  normal.     Left Ear: Tympanic membrane, ear canal and external ear normal.     Nose: Nose normal. No mucosal edema or rhinorrhea.     Mouth/Throat:     Pharynx: Uvula midline. No oropharyngeal exudate.  Eyes:     Conjunctiva/sclera: Conjunctivae normal.  Neck:     Thyroid: No thyromegaly.     Trachea: Trachea normal. No tracheal tenderness or tracheal deviation.  Cardiovascular:     Rate and Rhythm: Normal rate and regular rhythm.     Heart sounds: Normal heart sounds, S1 normal and S2 normal. No murmur heard. Pulmonary:     Effort: No respiratory distress.     Breath sounds: Normal breath sounds. No stridor. No wheezing (Scattered end expiratory wheezes posterior lung fields) or rales.  Lymphadenopathy:     Head:     Right side of head: No tonsillar adenopathy.     Left side of head: No tonsillar adenopathy.     Cervical: No cervical adenopathy.  Skin:    Findings: No erythema or rash.     Nails: There is no clubbing.  Neurological:     Mental Status: She is alert.     Diagnostics:    Spirometry was performed and demonstrated an FEV1 of 2.47 at 86 % of predicted.  The patient had an Asthma Control Test with the following results: ACT Total Score: 16.    Assessment and Plan:   1. Not well controlled moderate persistent asthma   2. Perennial allergic rhinitis   3. Seasonal allergic rhinitis due to pollen   4. LPRD (laryngopharyngeal reflux disease)     1.  Treat and prevent inflammation:  A.  TRELEGY 100 - 1 inhalation 1 time per day  (replaces Advair) B.  Flonase - 1-2 sprays each nostril 3-7 times per week  C.  Immunotherapy   2. Treat reflux induced inflammation of airway:   A. Increase Omeprazole to 40 mg - 1 tablet 2 times per day  B. Minimize caffeine and chocolate   C. Replace throat clearing with swallowing/ drinking maneuver  4. If needed:   A. Antihistamine  B. Nasal saline  C. AIRSUPRA - 2 inhalations every 6 hours  5. Return to clinic end of  December 2024 or earlier if problem   Shelia Bowers has an inflamed and irritated respiratory tract and this is on the basis of her recent COVID infection and reflux induced respiratory disease and we are going to have her utilize a plan to address both those issues as noted above and see her back in this clinic in approximately 6 weeks.  I suspect at that point we will be able to consolidate her therapy.  Laurette Schimke, MD Allergy / Immunology [facility]

## 2023-05-16 NOTE — Patient Instructions (Addendum)
  1.  Treat and prevent inflammation:  A.  TRELEGY 100 - 1 inhalation 1 time per day  (replaces Advair) B.  Flonase - 1-2 sprays each nostril 3-7 times per week  C.  Immunotherapy   2. Treat reflux induced inflammation of airway:   A. Increase Omeprazole to 40 mg - 1 tablet 2 times per day  B. Minimize caffeine and chocolate   C. Replace throat clearing with swallowing/ drinking maneuver  4. If needed:   A. Antihistamine  B. Nasal saline  C. AIRSUPRA - 2 inhalations every 6 hours  5. Return to clinic end of December 2024 or earlier if problem

## 2023-05-16 NOTE — Telephone Encounter (Signed)
Patient's injection records and extract refill consent form has been faxed to [facility] to [phone] and is currently pending.

## 2023-05-17 ENCOUNTER — Encounter: Payer: Self-pay | Admitting: Allergy and Immunology

## 2023-05-17 ENCOUNTER — Ambulatory Visit (INDEPENDENT_AMBULATORY_CARE_PROVIDER_SITE_OTHER): Payer: 59

## 2023-05-17 ENCOUNTER — Ambulatory Visit (INDEPENDENT_AMBULATORY_CARE_PROVIDER_SITE_OTHER): Payer: 59 | Admitting: Podiatry

## 2023-05-17 ENCOUNTER — Encounter: Payer: Self-pay | Admitting: Podiatry

## 2023-05-17 DIAGNOSIS — M722 Plantar fascial fibromatosis: Secondary | ICD-10-CM | POA: Diagnosis not present

## 2023-05-17 DIAGNOSIS — M7662 Achilles tendinitis, left leg: Secondary | ICD-10-CM | POA: Diagnosis not present

## 2023-05-17 DIAGNOSIS — M62462 Contracture of muscle, left lower leg: Secondary | ICD-10-CM

## 2023-05-17 MED ORDER — MELOXICAM 15 MG PO TABS: 15.0000 mg | ORAL_TABLET | Freq: Every day | ORAL | 3 refills | Status: DC

## 2023-05-17 NOTE — Patient Instructions (Signed)
Plantar Fasciitis (Heel Spur Syndrome) with Rehab The plantar fascia is a fibrous, ligament-like, soft-tissue structure that spans the bottom of the foot. Plantar fasciitis is a condition that causes pain in the foot due to inflammation of the tissue. SYMPTOMS   Pain and tenderness on the underneath side of the foot.  Pain that worsens with standing or walking. CAUSES  Plantar fasciitis is caused by irritation and injury to the plantar fascia on the underneath side of the foot. Common mechanisms of injury include:  Direct trauma to bottom of the foot.  Damage to a small nerve that runs under the foot where the main fascia attaches to the heel bone.  Stress placed on the plantar fascia due to bone spurs. RISK INCREASES WITH:   Activities that place stress on the plantar fascia (running, jumping, pivoting, or cutting).  Poor strength and flexibility.  Improperly fitted shoes.  Tight calf muscles.  Flat feet.  Failure to warm-up properly before activity.  Obesity. PREVENTION  Warm up and stretch properly before activity.  Allow for adequate recovery between workouts.  Maintain physical fitness:  Strength, flexibility, and endurance.  Cardiovascular fitness.  Maintain a health body weight.  Avoid stress on the plantar fascia.  Wear properly fitted shoes, including arch supports for individuals who have flat feet.  PROGNOSIS  If treated properly, then the symptoms of plantar fasciitis usually resolve without surgery. However, occasionally surgery is necessary.  RELATED COMPLICATIONS   Recurrent symptoms that may result in a chronic condition.  Problems of the lower back that are caused by compensating for the injury, such as limping.  Pain or weakness of the foot during push-off following surgery.  Chronic inflammation, scarring, and partial or complete fascia tear, occurring more often from repeated injections.  TREATMENT  Treatment initially involves the  use of ice and medication to help reduce pain and inflammation. The use of strengthening and stretching exercises may help reduce pain with activity, especially stretches of the Achilles tendon. These exercises may be performed at home or with a therapist. Your caregiver may recommend that you use heel cups of arch supports to help reduce stress on the plantar fascia. Occasionally, corticosteroid injections are given to reduce inflammation. If symptoms persist for greater than 6 months despite non-surgical (conservative), then surgery may be recommended.   MEDICATION   If pain medication is necessary, then nonsteroidal anti-inflammatory medications, such as aspirin and ibuprofen, or other minor pain relievers, such as acetaminophen, are often recommended.  Do not take pain medication within 7 days before surgery.  Prescription pain relievers may be given if deemed necessary by your caregiver. Use only as directed and only as much as you need.  Corticosteroid injections may be given by your caregiver. These injections should be reserved for the most serious cases, because they may only be given a certain number of times.  HEAT AND COLD  Cold treatment (icing) relieves pain and reduces inflammation. Cold treatment should be applied for 10 to 15 minutes every 2 to 3 hours for inflammation and pain and immediately after any activity that aggravates your symptoms. Use ice packs or massage the area with a piece of ice (ice massage).  Heat treatment may be used prior to performing the stretching and strengthening activities prescribed by your caregiver, physical therapist, or athletic trainer. Use a heat pack or soak the injury in warm water.  SEEK IMMEDIATE MEDICAL CARE IF:  Treatment seems to offer no benefit, or the condition worsens.  Any medications   produce adverse side effects.  EXERCISES- RANGE OF MOTION (ROM) AND STRETCHING EXERCISES - Plantar Fasciitis (Heel Spur Syndrome) These exercises  may help you when beginning to rehabilitate your injury. Your symptoms may resolve with or without further involvement from your physician, physical therapist or athletic trainer. While completing these exercises, remember:   Restoring tissue flexibility helps normal motion to return to the joints. This allows healthier, less painful movement and activity.  An effective stretch should be held for at least 30 seconds.  A stretch should never be painful. You should only feel a gentle lengthening or release in the stretched tissue.  RANGE OF MOTION - Toe Extension, Flexion  Sit with your right / left leg crossed over your opposite knee.  Grasp your toes and gently pull them back toward the top of your foot. You should feel a stretch on the bottom of your toes and/or foot.  Hold this stretch for 10 seconds.  Now, gently pull your toes toward the bottom of your foot. You should feel a stretch on the top of your toes and or foot.  Hold this stretch for 10 seconds. Repeat  times. Complete this stretch 3 times per day.   RANGE OF MOTION - Ankle Dorsiflexion, Active Assisted  Remove shoes and sit on a chair that is preferably not on a carpeted surface.  Place right / left foot under knee. Extend your opposite leg for support.  Keeping your heel down, slide your right / left foot back toward the chair until you feel a stretch at your ankle or calf. If you do not feel a stretch, slide your bottom forward to the edge of the chair, while still keeping your heel down.  Hold this stretch for 10 seconds. Repeat 3 times. Complete this stretch 2 times per day.   STRETCH  Gastroc, Standing  Place hands on wall.  Extend right / left leg, keeping the front knee somewhat bent.  Slightly point your toes inward on your back foot.  Keeping your right / left heel on the floor and your knee straight, shift your weight toward the wall, not allowing your back to arch.  You should feel a gentle stretch  in the right / left calf. Hold this position for 10 seconds. Repeat 3 times. Complete this stretch 2 times per day.  STRETCH  Soleus, Standing  Place hands on wall.  Extend right / left leg, keeping the other knee somewhat bent.  Slightly point your toes inward on your back foot.  Keep your right / left heel on the floor, bend your back knee, and slightly shift your weight over the back leg so that you feel a gentle stretch deep in your back calf.  Hold this position for 10 seconds. Repeat 3 times. Complete this stretch 2 times per day.  STRETCH  Gastrocsoleus, Standing  Note: This exercise can place a lot of stress on your foot and ankle. Please complete this exercise only if specifically instructed by your caregiver.   Place the ball of your right / left foot on a step, keeping your other foot firmly on the same step.  Hold on to the wall or a rail for balance.  Slowly lift your other foot, allowing your body weight to press your heel down over the edge of the step.  You should feel a stretch in your right / left calf.  Hold this position for 10 seconds.  Repeat this exercise with a slight bend in your right /   left knee. Repeat 3 times. Complete this stretch 2 times per day.   STRENGTHENING EXERCISES - Plantar Fasciitis (Heel Spur Syndrome)  These exercises may help you when beginning to rehabilitate your injury. They may resolve your symptoms with or without further involvement from your physician, physical therapist or athletic trainer. While completing these exercises, remember:   Muscles can gain both the endurance and the strength needed for everyday activities through controlled exercises.  Complete these exercises as instructed by your physician, physical therapist or athletic trainer. Progress the resistance and repetitions only as guided.  STRENGTH - Towel Curls  Sit in a chair positioned on a non-carpeted surface.  Place your foot on a towel, keeping your heel  on the floor.  Pull the towel toward your heel by only curling your toes. Keep your heel on the floor. Repeat 3 times. Complete this exercise 2 times per day.  STRENGTH - Ankle Inversion  Secure one end of a rubber exercise band/tubing to a fixed object (table, pole). Loop the other end around your foot just before your toes.  Place your fists between your knees. This will focus your strengthening at your ankle.  Slowly, pull your big toe up and in, making sure the band/tubing is positioned to resist the entire motion.  Hold this position for 10 seconds.  Have your muscles resist the band/tubing as it slowly pulls your foot back to the starting position. Repeat 3 times. Complete this exercises 2 times per day.  Document Released: 06/13/2005 Document Revised: 09/05/2011 Document Reviewed: 09/25/2008 [employer] Patient Information 2014 [employer]. Achilles Tendinitis  with Rehab Achilles tendinitis is a disorder of the Achilles tendon. The Achilles tendon connects the large calf muscles (Gastrocnemius and Soleus) to the heel bone (calcaneus). This tendon is sometimes called the heel cord. It is important for pushing-off and standing on your toes and is important for walking, running, or jumping. Tendinitis is often caused by overuse and repetitive microtrauma. SYMPTOMS  Pain, tenderness, swelling, warmth, and redness may occur over the Achilles tendon even at rest.  Pain with pushing off, or flexing or extending the ankle.  Pain that is worsened after or during activity. CAUSES   Overuse sometimes seen with rapid increase in exercise programs or in sports requiring running and jumping.  Poor physical conditioning (strength and flexibility or endurance).  Running sports, especially training running down hills.  Inadequate warm-up before practice or play or failure to stretch before participation.  Injury to the tendon. PREVENTION   Warm up and stretch before practice or  competition.  Allow time for adequate rest and recovery between practices and competition.  Keep up conditioning.  Keep up ankle and leg flexibility.  Improve or keep muscle strength and endurance.  Improve cardiovascular fitness.  Use proper technique.  Use proper equipment (shoes, skates).  To help prevent recurrence, taping, protective strapping, or an adhesive bandage may be recommended for several weeks after healing is complete. PROGNOSIS   Recovery may take weeks to several months to heal.  Longer recovery is expected if symptoms have been prolonged.  Recovery is usually quicker if the inflammation is due to a direct blow as compared with overuse or sudden strain. RELATED COMPLICATIONS   Healing time will be prolonged if the condition is not correctly treated. The injury must be given plenty of time to heal.  Symptoms can reoccur if activity is resumed too soon.  Untreated, tendinitis may increase the risk of tendon rupture requiring additional time for recovery   and possibly surgery. TREATMENT   The first treatment consists of rest anti-inflammatory medication, and ice to relieve the pain.  Stretching and strengthening exercises after resolution of pain will likely help reduce the risk of recurrence. Referral to a physical therapist or athletic trainer for further evaluation and treatment may be helpful.  A walking boot or cast may be recommended to rest the Achilles tendon. This can help break the cycle of inflammation and microtrauma.  Arch supports (orthotics) may be prescribed or recommended by your caregiver as an adjunct to therapy and rest.  Surgery to remove the inflamed tendon lining or degenerated tendon tissue is rarely necessary and has shown less than predictable results. MEDICATION   Nonsteroidal anti-inflammatory medications, such as aspirin and ibuprofen, may be used for pain and inflammation relief. Do not take within 7 days before surgery. Take  these as directed by your caregiver. Contact your caregiver immediately if any bleeding, stomach upset, or signs of allergic reaction occur. Other minor pain relievers, such as acetaminophen, may also be used.  Pain relievers may be prescribed as necessary by your caregiver. Do not take prescription pain medication for longer than 4 to 7 days. Use only as directed and only as much as you need.  Cortisone injections are rarely indicated. Cortisone injections may weaken tendons and predispose to rupture. It is better to give the condition more time to heal than to use them. HEAT AND COLD  Cold is used to relieve pain and reduce inflammation for acute and chronic Achilles tendinitis. Cold should be applied for 10 to 15 minutes every 2 to 3 hours for inflammation and pain and immediately after any activity that aggravates your symptoms. Use ice packs or an ice massage.  Heat may be used before performing stretching and strengthening activities prescribed by your caregiver. Use a heat pack or a warm soak. SEEK MEDICAL CARE IF:  Symptoms get worse or do not improve in 2 weeks despite treatment.  New, unexplained symptoms develop. Drugs used in treatment may produce side effects.  EXERCISES:  RANGE OF MOTION (ROM) AND STRETCHING EXERCISES - Achilles Tendinitis  These exercises may help you when beginning to rehabilitate your injury. Your symptoms may resolve with or without further involvement from your physician, physical therapist or athletic trainer. While completing these exercises, remember:   Restoring tissue flexibility helps normal motion to return to the joints. This allows healthier, less painful movement and activity.  An effective stretch should be held for at least 30 seconds.  A stretch should never be painful. You should only feel a gentle lengthening or release in the stretched tissue.  STRETCH  Gastroc, Standing   Place hands on wall.  Extend right / left leg, keeping the  front knee somewhat bent.  Slightly point your toes inward on your back foot.  Keeping your right / left heel on the floor and your knee straight, shift your weight toward the wall, not allowing your back to arch.  You should feel a gentle stretch in the right / left calf. Hold this position for 10 seconds. Repeat 3 times. Complete this stretch 2 times per day.  STRETCH  Soleus, Standing   Place hands on wall.  Extend right / left leg, keeping the other knee somewhat bent.  Slightly point your toes inward on your back foot.  Keep your right / left heel on the floor, bend your back knee, and slightly shift your weight over the back leg so that you feel a   gentle stretch deep in your back calf.  Hold this position for 10 seconds. Repeat 3 times. Complete this stretch 2 times per day.  STRETCH  Gastrocsoleus, Standing  Note: This exercise can place a lot of stress on your foot and ankle. Please complete this exercise only if specifically instructed by your caregiver.   Place the ball of your right / left foot on a step, keeping your other foot firmly on the same step.  Hold on to the wall or a rail for balance.  Slowly lift your other foot, allowing your body weight to press your heel down over the edge of the step.  You should feel a stretch in your right / left calf.  Hold this position for 10 seconds.  Repeat this exercise with a slight bend in your knee. Repeat 3 times. Complete this stretch 2 times per day.   STRENGTHENING EXERCISES - Achilles Tendinitis These exercises may help you when beginning to rehabilitate your injury. They may resolve your symptoms with or without further involvement from your physician, physical therapist or athletic trainer. While completing these exercises, remember:   Muscles can gain both the endurance and the strength needed for everyday activities through controlled exercises.  Complete these exercises as instructed by your physician,  physical therapist or athletic trainer. Progress the resistance and repetitions only as guided.  You may experience muscle soreness or fatigue, but the pain or discomfort you are trying to eliminate should never worsen during these exercises. If this pain does worsen, stop and make certain you are following the directions exactly. If the pain is still present after adjustments, discontinue the exercise until you can discuss the trouble with your clinician.  STRENGTH - Plantar-flexors   Sit with your right / left leg extended. Holding onto both ends of a rubber exercise band/tubing, loop it around the ball of your foot. Keep a slight tension in the band.  Slowly push your toes away from you, pointing them downward.  Hold this position for 10 seconds. Return slowly, controlling the tension in the band/tubing. Repeat 3 times. Complete this exercise 2 times per day.   STRENGTH - Plantar-flexors   Stand with your feet shoulder width apart. Steady yourself with a wall or table using as little support as needed.  Keeping your weight evenly spread over the width of your feet, rise up on your toes.*  Hold this position for 10 seconds. Repeat 3 times. Complete this exercise 2 times per day.  *If this is too easy, shift your weight toward your right / left leg until you feel challenged. Ultimately, you may be asked to do this exercise with your right / left foot only.  STRENGTH  Plantar-flexors, Eccentric  Note: This exercise can place a lot of stress on your foot and ankle. Please complete this exercise only if specifically instructed by your caregiver.   Place the balls of your feet on a step. With your hands, use only enough support from a wall or rail to keep your balance.  Keep your knees straight and rise up on your toes.  Slowly shift your weight entirely to your right / left toes and pick up your opposite foot. Gently and with controlled movement, lower your weight through your right /  left foot so that your heel drops below the level of the step. You will feel a slight stretch in the back of your calf at the end position.  Use the healthy leg to help rise up onto   the balls of both feet, then lower weight only on the right / left leg again. Build up to 15 repetitions. Then progress to 3 consecutive sets of 15 repetitions.*  After completing the above exercise, complete the same exercise with a slight knee bend (about 30 degrees). Again, build up to 15 repetitions. Then progress to 3 consecutive sets of 15 repetitions.* Perform this exercise 2 times per day.  *When you easily complete 3 sets of 15, your physician, physical therapist or athletic trainer may advise you to add resistance by wearing a backpack filled with additional weight.  STRENGTH - Plantar Flexors, Seated   Sit on a chair that allows your feet to rest flat on the ground. If necessary, sit at the edge of the chair.  Keeping your toes firmly on the ground, lift your right / left heel as far as you can without increasing any discomfort in your ankle. Repeat 3 times. Complete this exercise 2 times a day.  

## 2023-05-21 NOTE — Progress Notes (Addendum)
  Subjective:  Patient ID: Shelia Bowers, female    DOB: 1971-01-15,  MRN: [id number]  Chief Complaint  Patient presents with   Foot Pain    "I think I have Plantar Fasciitis.  My heel hurts.  The back of the heel is also bothering me."    52 y.o. female presents with the above complaint. History confirmed with patient.  Has been going on for a while now and worsening  Objective:  Physical Exam: warm, good capillary refill, no trophic changes or ulcerative lesions, normal DP and PT pulses, and normal sensory exam. Left Foot: point tenderness over the heel pad, tenderness at Achilles tendon insertion, and gastrocnemius equinus is noted with a positive silverskiold test  . 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 Plantar fasciitis   2. Achilles tendinitis, left leg   3. Gastrocnemius equinus of left lower extremity      Plan:  Patient was evaluated and treated and all questions answered.   Discussed the etiology and treatment options for plantar fasciitis and Achilles tendinitis including stretching, formal physical therapy, supportive shoegears such as a running shoe or sneaker, pre fabricated orthoses, injection therapy, and oral medications. We also discussed the role of surgical treatment of this for patients who do not improve after exhausting non-surgical treatment options.   -XR reviewed with patient -Educated patient on stretching and icing of the affected limb -Night splint dispensed, to stretch the Achilles passively at night. This is medically necessary to reduce the equinus contracture that is impacting her plantar fasciitis and pain. -Injection delivered to the plantar fascia of the left foot. -Rx for meloxicam . Educated on use, risks and benefits of the medication  After sterile prep with povidone-iodine  solution and alcohol, the left heel was injected with 0.5cc 2% xylocaine plain, 0.5cc 0.5% marcaine plain, 5mg  triamcinolone  acetonide, and 2mg  dexamethasone  was injected along the medial plantar fascia at the insertion on the plantar calcaneus. The patient tolerated the procedure well without complication.  Return in about 6 weeks (around 06/28/2023) for re-check Achilles tendon, recheck plantar fasciitis.

## 2023-06-07 NOTE — Telephone Encounter (Signed)
Called and left a message with the injection clinic asking for a return call to obtain status on patient's allergy serum refill.

## 2023-06-11 ENCOUNTER — Other Ambulatory Visit: Payer: Self-pay | Admitting: Allergy and Immunology

## 2023-06-13 NOTE — Telephone Encounter (Signed)
Patient's allergy vials have been received.

## 2023-06-20 ENCOUNTER — Ambulatory Visit: Payer: Self-pay | Admitting: *Deleted

## 2023-06-20 ENCOUNTER — Ambulatory Visit: Payer: 59 | Admitting: Allergy and Immunology

## 2023-06-20 VITALS — BP 128/70 | HR 82 | Temp 98.0°F | Resp 16 | Wt 293.6 lb

## 2023-06-20 DIAGNOSIS — J3089 Other allergic rhinitis: Secondary | ICD-10-CM

## 2023-06-20 DIAGNOSIS — B370 Candidal stomatitis: Secondary | ICD-10-CM

## 2023-06-20 DIAGNOSIS — K219 Gastro-esophageal reflux disease without esophagitis: Secondary | ICD-10-CM

## 2023-06-20 DIAGNOSIS — J301 Allergic rhinitis due to pollen: Secondary | ICD-10-CM

## 2023-06-20 DIAGNOSIS — J4540 Moderate persistent asthma, uncomplicated: Secondary | ICD-10-CM

## 2023-06-20 DIAGNOSIS — J309 Allergic rhinitis, unspecified: Secondary | ICD-10-CM

## 2023-06-20 MED ORDER — AIRSUPRA 90-80 MCG/ACT IN AERO: 2.0000 | INHALATION_SPRAY | RESPIRATORY_TRACT | 1 refills | Status: DC | PRN

## 2023-06-20 MED ORDER — TRELEGY ELLIPTA 100-62.5-25 MCG/ACT IN AEPB: 1.0000 | INHALATION_SPRAY | Freq: Every morning | RESPIRATORY_TRACT | 1 refills | Status: DC

## 2023-06-20 MED ORDER — NYSTATIN 100000 UNIT/ML MT SUSP: 5.0000 mL | Freq: Four times a day (QID) | OROMUCOSAL | 0 refills | Status: DC

## 2023-06-20 MED ORDER — FLUTICASONE PROPIONATE 50 MCG/ACT NA SUSP: 2.0000 | Freq: Every day | NASAL | 1 refills | Status: DC

## 2023-06-20 MED ORDER — LEVOCETIRIZINE DIHYDROCHLORIDE 5 MG PO TABS: 5.0000 mg | ORAL_TABLET | Freq: Every day | ORAL | 1 refills | Status: DC | PRN

## 2023-06-20 MED ORDER — OMEPRAZOLE 40 MG PO CPDR: 40.0000 mg | DELAYED_RELEASE_CAPSULE | Freq: Two times a day (BID) | ORAL | 1 refills | Status: DC

## 2023-06-20 NOTE — Progress Notes (Signed)
[place] - [place] - [place] - [place] - [place]   Follow-up Note  Referring Provider: Dortha Kern, MD Primary Provider: Dortha Kern, MD Date of Office Visit: 06/20/2023  Subjective:   Shelia Bowers (DOB: 11/07/1970) is a 52 y.o. female who [facility] in re-evaluation of the following:  HPI: Shelia Bowers returns to this clinic in evaluation of asthma, allergic rhinoconjunctivitis, LPR.  I last saw her in this clinic 16 May 2023.  During her last visit she appeared to have persistent issues with respiratory tract inflammation that appeared to be a long-term sequela of COVID infection.  We started her on a collection of anti-inflammatory agents for her airway including the introduction of Trelegy and because she had a flare of her LPR we also increased her omeprazole to twice a day and had her perform some behavioral issues regarding throat clearing.  She is much better at this point in time and really has no significant issues involving her lungs, her head, or her throat.  She does feel as though her mouth might be a little bit irritated as though she has thrush.  She does not need to use any short acting bronchodilator at this point in time.  She continues to use nasal wash twice a day as well as a nasal steroid.  She continues on immunotherapy.  She did obtain the flu vaccine this year.  Allergies as of 06/20/2023       Reactions   Azithromycin Hives   Clarithromycin Hives   Macrolides And Ketolides Hives   Medroxyprogesterone Other (See Comments)   Mono like sx, fatigue, nausea, vomiting Mono like sx, fatigue, nausea, vomiting Mono like sx, fatigue, nausea, vomiting Mono like sx, fatigue, nausea, vomiting   Rizatriptan Palpitations        Medication List    Airsupra 90-80 MCG/ACT Aero Generic drug: Albuterol-Budesonide Inhale 2 Inhalations into the lungs every 4 (four) hours as needed.   albuterol 108 (90  Base) MCG/ACT inhaler Commonly known as: VENTOLIN HFA Inhale 2 puffs into the lungs every 4 (four) hours as needed for wheezing or shortness of breath.   fexofenadine 180 MG tablet Commonly known as: Allegra Allergy Take 1 tablet (180 mg total) by mouth daily.   fluticasone 50 MCG/ACT nasal spray Commonly known as: FLONASE Place 2 sprays into both nostrils daily. 1-2 sprays in each nostril during upper airway symptoms   fluticasone-salmeterol 100-50 MCG/ACT Aepb Commonly known as: ADVAIR Inhale 1 puff into the lungs 2 (two) times daily.   levocetirizine 5 MG tablet Commonly known as: XYZAL TAKE 1 TABLET BY MOUTH EVERY DAY AS NEEDED FOR ALLERGIES CAN TAKE EXTRA DOSE DURING FLARE UPS   meloxicam 15 MG tablet Commonly known as: Mobic Take 1 tablet (15 mg total) by mouth daily.   multivitamin capsule Take 1 capsule by mouth daily.   omeprazole 20 MG tablet Commonly known as: PRILOSEC OTC Take by mouth.   omeprazole 40 MG capsule Commonly known as: PRILOSEC Take 1 capsule (40 mg total) by mouth 2 (two) times daily.   sertraline 50 MG tablet Commonly known as: ZOLOFT take 1.5 tablet oral daily   Trelegy Ellipta 100-62.5-25 MCG/ACT Aepb Generic drug: Fluticasone-Umeclidin-Vilant Inhale 1 Inhalation into the lungs every morning.    Past Medical History:  Diagnosis Date   Asthma     Past Surgical History:  Procedure Laterality Date   CARPAL TUNNEL RELEASE Bilateral    TUBAL LIGATION  Review of systems negative except as noted in HPI / PMHx or noted below:  Review of Systems  Constitutional: Negative.   HENT: Negative.    Eyes: Negative.   Respiratory: Negative.    Cardiovascular: Negative.   Gastrointestinal: Negative.   Genitourinary: Negative.   Musculoskeletal: Negative.   Skin: Negative.   Neurological: Negative.   Endo/Heme/Allergies: Negative.   Psychiatric/Behavioral: Negative.       Objective:   Vitals:   06/20/23 0852  BP: 128/70   Pulse: 82  Resp: 16  Temp: 98 F (36.7 C)  SpO2: 97%      Weight: 293 lb 9.6 oz (133.2 kg)   Physical Exam Constitutional:      Appearance: She is not diaphoretic.  HENT:     Head: Normocephalic.     Right Ear: Tympanic membrane, ear canal and external ear normal.     Left Ear: Tympanic membrane, ear canal and external ear normal.     Nose: Nose normal. No mucosal edema or rhinorrhea.     Mouth/Throat:     Pharynx: Uvula midline. No oropharyngeal exudate.  Eyes:     Conjunctiva/sclera: Conjunctivae normal.  Neck:     Thyroid: No thyromegaly.     Trachea: Trachea normal. No tracheal tenderness or tracheal deviation.  Cardiovascular:     Rate and Rhythm: Normal rate and regular rhythm.     Heart sounds: Normal heart sounds, S1 normal and S2 normal. No murmur heard. Pulmonary:     Effort: No respiratory distress.     Breath sounds: Normal breath sounds. No stridor. No wheezing or rales.  Lymphadenopathy:     Head:     Right side of head: No tonsillar adenopathy.     Left side of head: No tonsillar adenopathy.     Cervical: No cervical adenopathy.  Skin:    Findings: No erythema or rash.     Nails: There is no clubbing.  Neurological:     Mental Status: She is alert.     Diagnostics: Spirometry was performed and demonstrated an FEV1 of 2.50 at 87 % of predicted.  Assessment and Plan:   1. Asthma, moderate persistent, well-controlled   2. Perennial allergic rhinitis   3. Seasonal allergic rhinitis due to pollen   4. LPRD (laryngopharyngeal reflux disease)   5. Allergic rhinitis, unspecified seasonality, unspecified trigger   6. Thrush    1.  Treat and prevent inflammation:  A.  TRELEGY 100 - 1 inhalation 1 time per day  B.  Flonase - 1-2 sprays each nostril 3-7 times per week  C.  Immunotherapy   2. Treat reflux induced inflammation of airway:   A. Omeprazole 40 mg - 1 tablet 1-2 times per day  B. Minimize caffeine and chocolate   C. Replace throat  clearing with swallowing/ drinking maneuver  3.  Prevent thrush:   A. Nystatin - 5 mls swish, gargle swallow after Trelegy use  4. If needed:   A. Antihistamine  B. Nasal saline  C. AIRSUPRA - 2 inhalations every 6 hours  5. Return to clinic 6 months or earlier if problem   Shelia Bowers has had very significant improvement since her last visit and she will remain on anti-inflammatory agents for her airway including Trelegy and Flonase and also continue on immunotherapy and she will remain on treatment for her LPR as noted above.  She believes that she might be developing some thrush and she can use nystatin after her Trelegy.  I will see  her back in this clinic in 6 months or earlier if a problem.  Laurette Schimke, MD Allergy / Immunology [facility]

## 2023-06-20 NOTE — Patient Instructions (Addendum)
  1.  Treat and prevent inflammation:  A.  TRELEGY 100 - 1 inhalation 1 time per day  B.  Flonase - 1-2 sprays each nostril 3-7 times per week  C.  Immunotherapy   2. Treat reflux induced inflammation of airway:   A. Omeprazole 40 mg - 1 tablet 1-2 times per day  B. Minimize caffeine and chocolate   C. Replace throat clearing with swallowing/ drinking maneuver  3.  Prevent thrush:   A. Nystatin - 5 mls swish, gargle swallow after Trelegy use  4. If needed:   A. Antihistamine  B. Nasal saline  C. AIRSUPRA - 2 inhalations every 6 hours  5. Return to clinic 6 months or earlier if problem

## 2023-06-26 ENCOUNTER — Encounter: Payer: Self-pay | Admitting: Allergy and Immunology

## 2023-07-03 ENCOUNTER — Encounter: Payer: Self-pay | Admitting: Podiatry

## 2023-07-03 ENCOUNTER — Ambulatory Visit (INDEPENDENT_AMBULATORY_CARE_PROVIDER_SITE_OTHER): Payer: BC Managed Care – PPO | Admitting: Podiatry

## 2023-07-03 DIAGNOSIS — M7662 Achilles tendinitis, left leg: Secondary | ICD-10-CM | POA: Diagnosis not present

## 2023-07-03 DIAGNOSIS — M722 Plantar fascial fibromatosis: Secondary | ICD-10-CM

## 2023-07-03 NOTE — Progress Notes (Signed)
  Subjective:  Patient ID: Shelia Bowers, female    DOB: 07-21-1970,  MRN: [id number]  Chief Complaint  Patient presents with   Plantar Fasciitis    It's better.    53 y.o. female presents with the above complaint. History confirmed with patient.  Doing much better about 70% improvement after the last injection did much better than the previous treatments when she had this a couple years ago.  She does still need to take the meloxicam  daily she notes still some pain in the Achilles  Objective:  Physical Exam: warm, good capillary refill, no trophic changes or ulcerative lesions, normal DP and PT pulses, and normal sensory exam. Left Foot: point tenderness over the heel pad, tenderness at Achilles tendon insertion, and gastrocnemius equinus is noted with a positive silverskiold test  . 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 Plantar fasciitis   2. Achilles tendinitis, left leg       Plan:  Patient was evaluated and treated and all questions answered.  Excellent improvement with 70% relief expect at this point she does not need further injection therapy and should resolve with continued physical therapy at home and can continue meloxicam  as needed transition off of it over the next few weeks and utilize OTC ibuprofen  or Aleve as needed.  Formal physical therapy may benefit if this persist another 6 weeks or more.  We also discussed long-term correction with surgical intervention but she is not headed towards this currently.  Follow-up with me as needed  Return if symptoms worsen or fail to improve.

## 2023-07-21 ENCOUNTER — Ambulatory Visit (INDEPENDENT_AMBULATORY_CARE_PROVIDER_SITE_OTHER): Payer: Self-pay

## 2023-07-21 DIAGNOSIS — J309 Allergic rhinitis, unspecified: Secondary | ICD-10-CM | POA: Diagnosis not present

## 2023-08-02 ENCOUNTER — Ambulatory Visit (INDEPENDENT_AMBULATORY_CARE_PROVIDER_SITE_OTHER): Payer: BC Managed Care – PPO

## 2023-08-02 DIAGNOSIS — J309 Allergic rhinitis, unspecified: Secondary | ICD-10-CM

## 2023-08-02 DIAGNOSIS — Z1231 Encounter for screening mammogram for malignant neoplasm of breast: Secondary | ICD-10-CM | POA: Diagnosis not present

## 2023-08-02 LAB — HM MAMMOGRAPHY

## 2023-08-14 ENCOUNTER — Encounter: Payer: Self-pay | Admitting: Podiatry

## 2023-08-14 ENCOUNTER — Ambulatory Visit (INDEPENDENT_AMBULATORY_CARE_PROVIDER_SITE_OTHER): Payer: BC Managed Care – PPO | Admitting: Podiatry

## 2023-08-14 DIAGNOSIS — M7662 Achilles tendinitis, left leg: Secondary | ICD-10-CM | POA: Diagnosis not present

## 2023-08-14 DIAGNOSIS — M62462 Contracture of muscle, left lower leg: Secondary | ICD-10-CM

## 2023-08-14 DIAGNOSIS — M722 Plantar fascial fibromatosis: Secondary | ICD-10-CM | POA: Diagnosis not present

## 2023-08-14 NOTE — Progress Notes (Signed)
  Subjective:  Patient ID: Shelia Bowers, female    DOB: 05/03/1971,  MRN: [id number]  Chief Complaint  Patient presents with   Tendonitis    "It's better than it was when I made the appointment.  He turned me loose and then it got ugly again."    53 y.o. female presents with the above complaint. History confirmed with patient.  Has worsening and was severe 2 weeks ago.  She is still using the night splint and boot and meloxicam  Objective:  Physical Exam: warm, good capillary refill, no trophic changes or ulcerative lesions, normal DP and PT pulses, and normal sensory exam. Left Foot: point tenderness over the heel pad, tenderness at Achilles tendon insertion, and gastrocnemius equinus is noted with a positive silverskiold test  . 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 Achilles tendinitis, left leg   2. Plantar fasciitis   3. Gastrocnemius equinus of left lower extremity       Plan:  Patient was evaluated and treated and all questions answered.  Still quite painful and has recurred for third time now.  I recommend MRI to evaluate for partial tearing of either structure and it would begin to consider other options including surgical considerations.  MRI has been ordered she will continue utilize the night splint and meloxicam, discussed if it becomes severe again that methylprednisone taper can offer some relief but I would avoid further injections at this point until he know more about the integrity of both soft tissue structures.  She will let me know when her MRI is in schedule follow-up visit 2 weeks after this to review the results and discuss next treatment steps  No follow-ups on file.

## 2023-08-14 NOTE — Patient Instructions (Addendum)

## 2023-08-15 ENCOUNTER — Encounter: Payer: Self-pay | Admitting: Podiatry

## 2023-08-21 ENCOUNTER — Ambulatory Visit
Admission: RE | Admit: 2023-08-21 | Discharge: 2023-08-21 | Disposition: A | Discharge status: Home / Self Care | Payer: BC Managed Care – PPO | Source: Ambulatory Visit | Attending: Podiatry | Admitting: Podiatry

## 2023-08-21 DIAGNOSIS — M722 Plantar fascial fibromatosis: Secondary | ICD-10-CM

## 2023-08-21 DIAGNOSIS — M25572 Pain in left ankle and joints of left foot: Secondary | ICD-10-CM | POA: Diagnosis not present

## 2023-08-21 DIAGNOSIS — M7662 Achilles tendinitis, left leg: Secondary | ICD-10-CM

## 2023-08-22 ENCOUNTER — Other Ambulatory Visit: Payer: BC Managed Care – PPO

## 2023-08-24 ENCOUNTER — Ambulatory Visit (INDEPENDENT_AMBULATORY_CARE_PROVIDER_SITE_OTHER): Payer: BC Managed Care – PPO | Admitting: *Deleted

## 2023-08-24 DIAGNOSIS — J309 Allergic rhinitis, unspecified: Secondary | ICD-10-CM

## 2023-08-30 ENCOUNTER — Encounter: Payer: Self-pay | Admitting: Podiatry

## 2023-08-30 ENCOUNTER — Ambulatory Visit (INDEPENDENT_AMBULATORY_CARE_PROVIDER_SITE_OTHER): Payer: BC Managed Care – PPO | Admitting: Podiatry

## 2023-08-30 DIAGNOSIS — M722 Plantar fascial fibromatosis: Secondary | ICD-10-CM | POA: Diagnosis not present

## 2023-08-30 DIAGNOSIS — M62462 Contracture of muscle, left lower leg: Secondary | ICD-10-CM | POA: Diagnosis not present

## 2023-08-30 DIAGNOSIS — M7662 Achilles tendinitis, left leg: Secondary | ICD-10-CM | POA: Diagnosis not present

## 2023-08-30 MED ORDER — MELOXICAM 15 MG PO TABS
15.0000 mg | ORAL_TABLET | Freq: Every day | ORAL | 3 refills | Status: DC
Start: 2023-08-30 — End: 2024-05-20

## 2023-08-30 MED ORDER — PREDNISONE 5 MG PO TABS: ORAL_TABLET | ORAL | 0 refills | Status: AC

## 2023-08-30 NOTE — Progress Notes (Signed)
  Subjective:  Patient ID: Shelia Bowers, female    DOB: [birth year],  MRN: [id number]  Chief Complaint  Patient presents with   Tendonitis    "I'm here for a recheck, post MRI."    53 y.o. female presents with the above complaint. History confirmed with patient.  She returns for open is completed the MRI  Objective:  Physical Exam: warm, good capillary refill, no trophic changes or ulcerative lesions, normal DP and PT pulses, and normal sensory exam. Left Foot: point tenderness over the heel pad and gastrocnemius equinus is noted with a positive silverskiold test no pain posterior heel today . Radiographs: Multiple views x-ray of the left foot: no fracture, dislocation, swelling or degenerative changes noted and Haglund deformity noted with no posterior or plantar spurring  MRI showed Achilles tendinosis without tearing of the Achilles, intact plantar fascia, thickening approximately 5 to 6 mm of the medial band Assessment:   1. Achilles tendinitis, left leg   2. Plantar fasciitis   3. Gastrocnemius equinus of left lower extremity       Plan:  Patient was evaluated and treated and all questions answered.  We reviewed the results of the MRI.  We discussed the chronic plantar fasciitis and thickening that she has had that has been recalcitrant to nonoperative treatment.  Her Achilles tendinitis is improving and there is no tearing.  We discussed further treatment of this including surgical treatment.  Surgical we discussed endoscopic plantar fasciotomy and gastrocnemius recession, I also recommended platelet rich plasma injection for the Achilles tendon and plantar fascia.  Discussed the risk benefits and potential complications of this including risk of infection neurovascular injury as well as the recovery process including the period of restricted weightbearing in cam boot for 6 weeks followed by physical therapy postoperatively.  All questions addressed.  Informed consent signed  and reviewed.   Surgical plan:  Procedure: -Left EPF and endoscopic gastroc and PRP injection  Location: -[facility]  Anesthesia plan: -Sedation with regional block  Postoperative pain plan: - Tylenol 1000 mg every 6 hours, ibuprofen 600 mg every 6 hours, gabapentin 300 mg every 8 hours x5 days, oxycodone 5 mg 1-2 tabs every 6 hours only as needed  DVT prophylaxis: -Aspirin 325 mg twice daily  WB Restrictions / DME needs: -WBAT in CAM boot postop   No follow-ups on file.

## 2023-09-05 DIAGNOSIS — F40243 Fear of flying: Secondary | ICD-10-CM | POA: Diagnosis not present

## 2023-09-05 DIAGNOSIS — F5104 Psychophysiologic insomnia: Secondary | ICD-10-CM | POA: Diagnosis not present

## 2023-09-05 DIAGNOSIS — Z79899 Other long term (current) drug therapy: Secondary | ICD-10-CM | POA: Diagnosis not present

## 2023-09-06 ENCOUNTER — Ambulatory Visit: Payer: BC Managed Care – PPO | Admitting: Podiatry

## 2023-09-13 ENCOUNTER — Encounter: Payer: Self-pay | Admitting: Allergy and Immunology

## 2023-09-13 ENCOUNTER — Encounter: Payer: Self-pay | Admitting: Podiatry

## 2023-09-13 ENCOUNTER — Other Ambulatory Visit: Payer: Self-pay

## 2023-09-13 MED ORDER — EPINEPHRINE 0.3 MG/0.3ML IJ SOAJ: 0.3000 mg | INTRAMUSCULAR | 1 refills | Status: AC | PRN

## 2023-09-15 ENCOUNTER — Ambulatory Visit (INDEPENDENT_AMBULATORY_CARE_PROVIDER_SITE_OTHER): Payer: Self-pay

## 2023-09-15 DIAGNOSIS — J309 Allergic rhinitis, unspecified: Secondary | ICD-10-CM | POA: Diagnosis not present

## 2023-09-29 ENCOUNTER — Telehealth: Payer: Self-pay | Admitting: Podiatry

## 2023-09-29 NOTE — Telephone Encounter (Signed)
 DOS: 10/20/23  EPF LT [phone])  GASTROCNEMIUS RECESS LT (19147) INJECTION [phone])   EFFECTIVE DATE: 06/28/23  DEDUCTIBLE:  $3,500.00  REMAINING:  $3,237.34  OOP:  $7,000.00  REMAINING:  $6,356.91  CO INSURANCE : 20%  PER ANGELA B OF [employer] NO PRIOR AUTH IS REQ FOR CPT CODES [id number]  REF# [id number] AM

## 2023-10-06 NOTE — Telephone Encounter (Signed)
 Per pt request [facility] forms/notes to her email and HR [email or web address]

## 2023-10-12 ENCOUNTER — Ambulatory Visit (INDEPENDENT_AMBULATORY_CARE_PROVIDER_SITE_OTHER): Payer: Self-pay

## 2023-10-12 DIAGNOSIS — J309 Allergic rhinitis, unspecified: Secondary | ICD-10-CM

## 2023-10-20 ENCOUNTER — Other Ambulatory Visit: Payer: Self-pay | Admitting: Podiatry

## 2023-10-20 DIAGNOSIS — M216X2 Other acquired deformities of left foot: Secondary | ICD-10-CM | POA: Diagnosis not present

## 2023-10-20 DIAGNOSIS — M7662 Achilles tendinitis, left leg: Secondary | ICD-10-CM | POA: Diagnosis not present

## 2023-10-20 DIAGNOSIS — G8918 Other acute postprocedural pain: Secondary | ICD-10-CM | POA: Diagnosis not present

## 2023-10-20 DIAGNOSIS — M722 Plantar fascial fibromatosis: Secondary | ICD-10-CM | POA: Diagnosis not present

## 2023-10-20 MED ORDER — GABAPENTIN 300 MG PO CAPS: 300.0000 mg | ORAL_CAPSULE | Freq: Three times a day (TID) | ORAL | 0 refills | Status: DC

## 2023-10-20 MED ORDER — IBUPROFEN 600 MG PO TABS: 600.0000 mg | ORAL_TABLET | Freq: Four times a day (QID) | ORAL | 0 refills | Status: AC | PRN

## 2023-10-20 MED ORDER — TRAMADOL HCL 50 MG PO TABS: 50.0000 mg | ORAL_TABLET | Freq: Four times a day (QID) | ORAL | 0 refills | Status: AC | PRN

## 2023-10-20 MED ORDER — ACETAMINOPHEN 500 MG PO TABS: 1000.0000 mg | ORAL_TABLET | Freq: Four times a day (QID) | ORAL | 0 refills | Status: AC | PRN

## 2023-10-25 ENCOUNTER — Encounter: Payer: Self-pay | Admitting: Podiatry

## 2023-10-25 ENCOUNTER — Ambulatory Visit (INDEPENDENT_AMBULATORY_CARE_PROVIDER_SITE_OTHER): Admitting: Podiatry

## 2023-10-25 VITALS — Ht 66.0 in | Wt 293.0 lb

## 2023-10-25 DIAGNOSIS — M62462 Contracture of muscle, left lower leg: Secondary | ICD-10-CM

## 2023-10-25 DIAGNOSIS — M7662 Achilles tendinitis, left leg: Secondary | ICD-10-CM

## 2023-10-25 DIAGNOSIS — M546 Pain in thoracic spine: Secondary | ICD-10-CM | POA: Diagnosis not present

## 2023-10-25 DIAGNOSIS — M722 Plantar fascial fibromatosis: Secondary | ICD-10-CM

## 2023-10-25 NOTE — Progress Notes (Signed)
  Subjective:  Patient ID: Shelia Bowers, female    DOB: 04/22/1971,  MRN: [id number]  Chief Complaint  Patient presents with   Routine Post Op    POV # 1 DOS 10/20/23 --- LEFT EPF, CALF MUCLE LENTGHENING AND PRP INJECTION Healing well mild pain mostly from boot    53 y.o. female returns for post-op check.   Review of Systems: Negative except as noted in the HPI. Denies N/V/F/Ch.   Objective:  There were no vitals filed for this visit. Body mass index is 47.29 kg/m. Constitutional Well developed. Well nourished.  Vascular Foot warm and well perfused. Capillary refill normal to all digits.  Calf is soft and supple, no posterior calf or knee pain, negative Homans' sign  Neurologic Normal speech. Oriented to person, place, and time. Epicritic sensation to light touch grossly present bilaterally.  Dermatologic Skin healing well without signs of infection. Skin edges well coapted without signs of infection.  Orthopedic: Tenderness to palpation noted about the surgical site.   Assessment:   1. Achilles tendinitis, left leg   2. Plantar fasciitis   3. Gastrocnemius equinus of left lower extremity    Plan:  Patient was evaluated and treated and all questions answered.  S/p foot surgery left -Progressing as expected post-operatively.  May shower and apply Band-Aid and Ace wrap over the stitches.  Continue use of boot for weightbearing.  May remove to shower and perform range of motion.  Begin PT after next visit referral will be sent to [facility].  She will call to schedule.  Follow-up in 2 weeks for suture removal   No follow-ups on file.

## 2023-10-26 DIAGNOSIS — Z0271 Encounter for disability determination: Secondary | ICD-10-CM

## 2023-10-26 NOTE — Telephone Encounter (Signed)
 Faxed STD/notes to [facility] [phone]. Estimated RTW 11/17/23 after 10/20/23 surgery

## 2023-10-31 ENCOUNTER — Telehealth: Payer: Self-pay

## 2023-10-31 ENCOUNTER — Encounter: Payer: Self-pay | Admitting: Podiatry

## 2023-10-31 NOTE — Telephone Encounter (Signed)
 Recd email from pt needing RTW note. Sent via her email RTW 11/03/23 and wearing cam boot.

## 2023-10-31 NOTE — Telephone Encounter (Signed)
-----   Message from Floyce Hutching sent at 10/25/2023  8:57 AM EDT ----- [facility] PT referral attached for [facility] location if you can send.  Thank you!

## 2023-10-31 NOTE — Telephone Encounter (Signed)
 Referral, office note and demographics faxed to [address] [phone]

## 2023-11-08 ENCOUNTER — Encounter: Payer: Self-pay | Admitting: Podiatry

## 2023-11-08 ENCOUNTER — Ambulatory Visit (INDEPENDENT_AMBULATORY_CARE_PROVIDER_SITE_OTHER): Admitting: Podiatry

## 2023-11-08 VITALS — Ht 66.0 in | Wt 293.0 lb

## 2023-11-08 DIAGNOSIS — M7662 Achilles tendinitis, left leg: Secondary | ICD-10-CM

## 2023-11-08 DIAGNOSIS — M62462 Contracture of muscle, left lower leg: Secondary | ICD-10-CM

## 2023-11-08 DIAGNOSIS — M722 Plantar fascial fibromatosis: Secondary | ICD-10-CM

## 2023-11-08 NOTE — Progress Notes (Signed)
  Subjective:  Patient ID: Shelia Bowers, female    DOB: 04/29/1971,  MRN: [id number]  Chief Complaint  Patient presents with   Routine Post Op    POV # 2 DOS 10/20/23 --- LEFT EPF, CALF MUCLE LENTGHENING AND PRP INJECTION Overall feeling well some soreness in calf    53 y.o. female returns for post-op check.   Review of Systems: Negative except as noted in the HPI. Denies N/V/F/Ch.   Objective:  There were no vitals filed for this visit. Body mass index is 47.29 kg/m. Constitutional Well developed. Well nourished.  Vascular Foot warm and well perfused. Capillary refill normal to all digits.  Calf is soft and supple, no posterior calf or knee pain, negative Homans' sign  Neurologic Normal speech. Oriented to person, place, and time. Epicritic sensation to light touch grossly present bilaterally.  Dermatologic Incision well-healed about hypertrophic  Orthopedic: Tenderness to palpation noted about the surgical site.   Assessment:   1. Achilles tendinitis, left leg   2. Plantar fasciitis   3. Gastrocnemius equinus of left lower extremity    Plan:  Patient was evaluated and treated and all questions answered.  S/p foot surgery left - Continue weightbearing in boot.  Using night splint at night for sleep.  Which is fine.  She should call to schedule therapy.  Follow-up in 3 weeks and transition to shoe gear after that.   No follow-ups on file.

## 2023-11-10 ENCOUNTER — Other Ambulatory Visit: Payer: Self-pay | Admitting: Allergy and Immunology

## 2023-11-11 DIAGNOSIS — J0100 Acute maxillary sinusitis, unspecified: Secondary | ICD-10-CM | POA: Diagnosis not present

## 2023-11-13 ENCOUNTER — Ambulatory Visit (INDEPENDENT_AMBULATORY_CARE_PROVIDER_SITE_OTHER): Payer: Self-pay

## 2023-11-13 DIAGNOSIS — J309 Allergic rhinitis, unspecified: Secondary | ICD-10-CM | POA: Diagnosis not present

## 2023-11-15 DIAGNOSIS — M25572 Pain in left ankle and joints of left foot: Secondary | ICD-10-CM | POA: Diagnosis not present

## 2023-11-22 DIAGNOSIS — M25572 Pain in left ankle and joints of left foot: Secondary | ICD-10-CM | POA: Diagnosis not present

## 2023-11-24 DIAGNOSIS — M25572 Pain in left ankle and joints of left foot: Secondary | ICD-10-CM | POA: Diagnosis not present

## 2023-11-26 DIAGNOSIS — R509 Fever, unspecified: Secondary | ICD-10-CM | POA: Diagnosis not present

## 2023-11-26 DIAGNOSIS — J189 Pneumonia, unspecified organism: Secondary | ICD-10-CM | POA: Diagnosis not present

## 2023-11-26 DIAGNOSIS — R051 Acute cough: Secondary | ICD-10-CM | POA: Diagnosis not present

## 2023-11-26 DIAGNOSIS — R0981 Nasal congestion: Secondary | ICD-10-CM | POA: Diagnosis not present

## 2023-11-27 DIAGNOSIS — M25572 Pain in left ankle and joints of left foot: Secondary | ICD-10-CM | POA: Diagnosis not present

## 2023-11-29 ENCOUNTER — Ambulatory Visit (INDEPENDENT_AMBULATORY_CARE_PROVIDER_SITE_OTHER): Admitting: Podiatry

## 2023-11-29 DIAGNOSIS — M722 Plantar fascial fibromatosis: Secondary | ICD-10-CM

## 2023-11-29 DIAGNOSIS — M62462 Contracture of muscle, left lower leg: Secondary | ICD-10-CM

## 2023-11-29 DIAGNOSIS — M7662 Achilles tendinitis, left leg: Secondary | ICD-10-CM

## 2023-11-29 NOTE — Progress Notes (Signed)
  Subjective:  Patient ID: Shelia Bowers, female    DOB: 15-Oct-1970,  MRN: [id number]  No chief complaint on file.   53 y.o. female returns for post-op check.  She is doing well she has minimal pain she has been doing physical therapy with steroid they were able to get her in  Review of Systems: Negative except as noted in the HPI. Denies N/V/F/Ch.   Objective:  There were no vitals filed for this visit. There is no height or weight on file to calculate BMI. Constitutional Well developed. Well nourished.  Vascular Foot warm and well perfused. Capillary refill normal to all digits.  Calf is soft and supple, no posterior calf or knee pain, negative Homans' sign  Neurologic Normal speech. Oriented to person, place, and time. Epicritic sensation to light touch grossly present bilaterally.  Dermatologic Incision well-healed and does not hypertrophic  Orthopedic: She has no pain to palpation noted about the surgical site.  Good range of motion Achilles   Assessment:   1. Achilles tendinitis, left leg   2. Plantar fasciitis   3. Gastrocnemius equinus of left lower extremity     Plan:  Patient was evaluated and treated and all questions answered.  S/p foot surgery left - Doing well transition out of boot and back to shoe gear at this point gradually.  Continue PT.  Return in 6 weeks for final follow-up   Return in about 6 weeks (around 01/10/2024) for post op (no x-rays).

## 2023-12-01 DIAGNOSIS — M25572 Pain in left ankle and joints of left foot: Secondary | ICD-10-CM | POA: Diagnosis not present

## 2023-12-04 DIAGNOSIS — M25572 Pain in left ankle and joints of left foot: Secondary | ICD-10-CM | POA: Diagnosis not present

## 2023-12-08 DIAGNOSIS — M25572 Pain in left ankle and joints of left foot: Secondary | ICD-10-CM | POA: Diagnosis not present

## 2023-12-11 ENCOUNTER — Ambulatory Visit (INDEPENDENT_AMBULATORY_CARE_PROVIDER_SITE_OTHER): Payer: Self-pay

## 2023-12-11 DIAGNOSIS — J309 Allergic rhinitis, unspecified: Secondary | ICD-10-CM | POA: Diagnosis not present

## 2023-12-18 DIAGNOSIS — M25572 Pain in left ankle and joints of left foot: Secondary | ICD-10-CM | POA: Diagnosis not present

## 2023-12-19 ENCOUNTER — Other Ambulatory Visit: Payer: Self-pay

## 2023-12-19 ENCOUNTER — Encounter: Payer: Self-pay | Admitting: Allergy and Immunology

## 2023-12-19 ENCOUNTER — Ambulatory Visit (INDEPENDENT_AMBULATORY_CARE_PROVIDER_SITE_OTHER): Payer: 59 | Admitting: Allergy and Immunology

## 2023-12-19 VITALS — BP 134/88 | HR 92 | Temp 97.7°F | Resp 18 | Wt 301.0 lb

## 2023-12-19 DIAGNOSIS — J301 Allergic rhinitis due to pollen: Secondary | ICD-10-CM | POA: Diagnosis not present

## 2023-12-19 DIAGNOSIS — K219 Gastro-esophageal reflux disease without esophagitis: Secondary | ICD-10-CM

## 2023-12-19 DIAGNOSIS — J4540 Moderate persistent asthma, uncomplicated: Secondary | ICD-10-CM | POA: Diagnosis not present

## 2023-12-19 DIAGNOSIS — J3089 Other allergic rhinitis: Secondary | ICD-10-CM | POA: Diagnosis not present

## 2023-12-19 MED ORDER — TRELEGY ELLIPTA 100-62.5-25 MCG/ACT IN AEPB: 1.0000 | INHALATION_SPRAY | Freq: Every morning | RESPIRATORY_TRACT | 1 refills | Status: DC

## 2023-12-19 MED ORDER — AIRSUPRA 90-80 MCG/ACT IN AERO: 2.0000 | INHALATION_SPRAY | RESPIRATORY_TRACT | 1 refills | Status: DC | PRN

## 2023-12-19 MED ORDER — METHYLPREDNISOLONE ACETATE 80 MG/ML IJ SUSP
80.0000 mg | Freq: Once | INTRAMUSCULAR | Status: AC
  Administered 2023-12-19: 80 mg via INTRAMUSCULAR

## 2023-12-19 MED ORDER — FLUTICASONE PROPIONATE 50 MCG/ACT NA SUSP: 2.0000 | Freq: Every day | NASAL | 1 refills | Status: DC

## 2023-12-19 MED ORDER — PREDNISONE 10 MG PO TABS
20.0000 mg | ORAL_TABLET | Freq: Every day | ORAL | 0 refills | Status: AC
Start: 2023-12-19 — End: 2023-12-24

## 2023-12-19 MED ORDER — OMEPRAZOLE 40 MG PO CPDR
40.0000 mg | DELAYED_RELEASE_CAPSULE | Freq: Two times a day (BID) | ORAL | 1 refills | Status: DC
Start: 2023-12-19 — End: 2024-01-16

## 2023-12-19 MED ORDER — NYSTATIN 100000 UNIT/ML MT SUSP: 5.0000 mL | Freq: Four times a day (QID) | OROMUCOSAL | 1 refills | Status: DC

## 2023-12-19 NOTE — Progress Notes (Unsigned)
 [place]   Follow-up Note  Referring Provider: Derick Leita POUR, MD Primary Provider: Derick Leita POUR, MD Date of Office Visit: 12/19/2023  Subjective:   Shelia Bowers (DOB: [birth year]) is a 53 y.o. female who [facility] in re-evaluation of the following:  HPI: [name] returns to this clinic in evaluation of asthma, allergic rhinoconjunctivitis, LPR.  I last saw in this clinic 20 June 2023.  She was really doing very wonderful with her airway issue while consistently using anti-inflammatory agents for both her upper and lower airway and immunotherapy currently at every 4 weeks without adverse effect.  Rarely did she need to use the short acting bronchodilator and she could exert herself without any problem and she could smell and taste without any issues as she went through each season of the year.  She did not require systemic steroid or an antibiotic for any type of airway issue.  However, on 26 November 2023 she ended up in an urgent care center with coughing and wheezing and constitutional symptoms along with head congestion and a fever of 104 for which she was apparently COVID swab negative and flu swab negative and she was empirically treated with Augmentin and doxycycline.  She is much better in regard to resolving all of her constitutional symptoms and her cough is a little bit less but she still coughs and she thinks she still wheezes and she still has some mucus production and she cannot smell or taste at all.  Her reflux is under very good control at this point on her current plan.  Allergies as of 12/19/2023       Reactions   Azithromycin Hives   Clarithromycin Hives   Macrolides And Ketolides Hives   Medroxyprogesterone Other (See Comments)   Mono like sx, fatigue, nausea, vomiting Mono like sx, fatigue, nausea, vomiting Mono like sx, fatigue, nausea, vomiting Mono like sx, fatigue,  nausea, vomiting   Rizatriptan Palpitations        Medication List    Airsupra  90-80 MCG/ACT Aero Generic drug: Albuterol -Budesonide Inhale 2 Inhalations into the lungs every 4 (four) hours as needed.   albuterol  108 (90 Base) MCG/ACT inhaler Commonly known as: VENTOLIN  HFA Inhale 2 puffs into the lungs every 4 (four) hours as needed for wheezing or shortness of breath.   EPINEPHrine  0.3 mg/0.3 mL Soaj injection Commonly known as: Auvi-Q  Inject 0.3 mg into the muscle as needed for anaphylaxis.   estradiol 0.5 MG tablet Commonly known as: ESTRACE Take 0.5 mg by mouth daily.   fexofenadine  180 MG tablet Commonly known as: Allegra  Allergy Take 1 tablet (180 mg total) by mouth daily.   fluticasone  50 MCG/ACT nasal spray Commonly known as: FLONASE  Place 2 sprays into both nostrils daily. 1-2 sprays in each nostril during upper airway symptoms   fluticasone -salmeterol 100-50 MCG/ACT Aepb Commonly known as: ADVAIR Inhale 1 puff into the lungs 2 (two) times daily.   gabapentin  300 MG capsule Commonly known as: NEURONTIN  Take 1 capsule (300 mg total) by mouth 3 (three) times daily for 7 days.   levocetirizine 5 MG tablet Commonly known as: XYZAL  Take 1 tablet (5 mg total) by mouth daily as needed for allergies (Can take an extra dose during flare ups.).   meloxicam  15 MG tablet Commonly known as: Mobic  Take 1 tablet (15 mg total) by mouth daily.   multivitamin capsule Take 1 capsule by mouth daily.  nystatin  100000 UNIT/ML suspension Commonly known as: MYCOSTATIN  Take 5 mLs (500,000 Units total) by mouth 4 (four) times daily.   omeprazole  20 MG tablet Commonly known as: PRILOSEC OTC Take by mouth.   omeprazole  40 MG capsule Commonly known as: PRILOSEC Take 1 capsule (40 mg total) by mouth 2 (two) times daily.   sertraline 50 MG tablet Commonly known as: ZOLOFT take 1.5 tablet oral daily   Trelegy Ellipta  100-62.5-25 MCG/ACT Aepb Generic drug:  Fluticasone -Umeclidin-Vilant INHALE 1 INHALATION INTO THE LUNGS EVERY MORNING.   .  Past Medical History:  Diagnosis Date   Asthma     Past Surgical History:  Procedure Laterality Date   CARPAL TUNNEL RELEASE Bilateral    TUBAL LIGATION      Review of systems negative except as noted in HPI / PMHx or noted below:  Review of Systems  Constitutional: Negative.   HENT: Negative.    Eyes: Negative.   Respiratory: Negative.    Cardiovascular: Negative.   Gastrointestinal: Negative.   Genitourinary: Negative.   Musculoskeletal: Negative.   Skin: Negative.   Neurological: Negative.   Endo/Heme/Allergies: Negative.   Psychiatric/Behavioral: Negative.       Objective:   Vitals:   12/19/23 0916  BP: 134/88  Pulse: 92  Resp: 18  Temp: 97.7 F (36.5 C)  SpO2: 97%      Weight: (!) 301 lb (136.5 kg)   Physical Exam Constitutional:      Appearance: She is not diaphoretic.     Comments: Cough   HENT:     Head: Normocephalic.     Right Ear: Tympanic membrane, ear canal and external ear normal.     Left Ear: Tympanic membrane, ear canal and external ear normal.     Nose: Nose normal. No mucosal edema or rhinorrhea.     Mouth/Throat:     Pharynx: Uvula midline. No oropharyngeal exudate.   Eyes:     Conjunctiva/sclera: Conjunctivae normal.   Neck:     Thyroid: No thyromegaly.     Trachea: Trachea normal. No tracheal tenderness or tracheal deviation.   Cardiovascular:     Rate and Rhythm: Normal rate and regular rhythm.     Heart sounds: Normal heart sounds, S1 normal and S2 normal. No murmur heard. Pulmonary:     Effort: No respiratory distress.     Breath sounds: No stridor. Wheezing (expiratory wheezing posterior lung fields bilaterally) present. No rales.  Lymphadenopathy:     Head:     Right side of head: No tonsillar adenopathy.     Left side of head: No tonsillar adenopathy.     Cervical: No cervical adenopathy.   Skin:    Findings: No erythema or  rash.     Nails: There is no clubbing.   Neurological:     Mental Status: She is alert.     Diagnostics:    Spirometry was performed and demonstrated an FEV1 of 2.32 at 81 % of predicted.  The patient had an Asthma Control Test with the following results:  .    Results of a chest x-ray obtained 26 November 2023 identified the following:  The lungs are clear and pulmonary vascularity is within normal limits. The cardiomediastinal silhouette is within normal limits. There is no acute osseous or soft tissue pathology.   Assessment and Plan:   1. Not well controlled moderate persistent asthma   2. Perennial allergic rhinitis   3. Seasonal allergic rhinitis due to pollen   4. LPRD (laryngopharyngeal  reflux disease)    1.  Treat and prevent inflammation:  A.  TRELEGY 100 - 1 inhalation 1 time per day  B.  Flonase  - 1-2 sprays each nostril 3-7 times per week  C.  Immunotherapy   2. Treat reflux induced inflammation of airway:   A. Omeprazole  40 mg - 1 tablet 1-2 times per day  B. Minimize caffeine and chocolate   C. Replace throat clearing with swallowing/ drinking maneuver  3.  Prevent thrush:   A. Nystatin  - 5 mls swish, gargle swallow after Trelegy use  4. If needed:   A. Antihistamine  B. Nasal saline  C. AIRSUPRA  - 2 inhalations every 6 hours  5. For this recent event:   A. Depomedrol 80 IM delivered in clinic today  B. Prednisone  10 mg - 2 tablets 1 time per day for 5 days only  C. Further evaluation? Yes, if anosmia or coughing  6. Return to clinic 6 months or earlier if problem  7. Influenza = Tamiflu. Covid = Paxlovid   [name] has lots of inflammation in both her upper and lower airway as evidenced by her wheezing and that her anosmia that appears to be the result of a infectious disease contracted a little over 3 weeks ago.  I am going to give her systemic steroids to help with that inflammation while she continues on a collection of anti-inflammatory agents for  her airway including the use of her immunotherapy.  Fortunately her reflux is under good control at this point while using her omeprazole  currently at twice a day.  Will see what happens over the course of the next several weeks and make a determination about further evaluation and treatment based upon her response during this timeframe.  Camellia Denis, MD Allergy / Immunology [facility]

## 2023-12-19 NOTE — Patient Instructions (Addendum)
  1.  Treat and prevent inflammation:  A.  TRELEGY 100 - 1 inhalation 1 time per day  B.  Flonase  - 1-2 sprays each nostril 3-7 times per week  C.  Immunotherapy   2. Treat reflux induced inflammation of airway:   A. Omeprazole  40 mg - 1 tablet 1-2 times per day  B. Minimize caffeine and chocolate   C. Replace throat clearing with swallowing/ drinking maneuver  3.  Prevent thrush:   A. Nystatin  - 5 mls swish, gargle swallow after Trelegy use  4. If needed:   A. Antihistamine  B. Nasal saline  C. AIRSUPRA  - 2 inhalations every 6 hours  5. For this recent event:   A. Depomedrol 80 IM delivered in clinic today  B. Prednisone  10 mg - 2 tablets 1 time per day for 5 days only  C. Further evaluation? Yes, if anosmia or coughing  6. Return to clinic 6 months or earlier if problem  7. Influenza = Tamiflu. Covid = Paxlovid

## 2023-12-20 ENCOUNTER — Encounter: Payer: Self-pay | Admitting: Allergy and Immunology

## 2023-12-20 DIAGNOSIS — M25572 Pain in left ankle and joints of left foot: Secondary | ICD-10-CM | POA: Diagnosis not present

## 2023-12-25 DIAGNOSIS — M25572 Pain in left ankle and joints of left foot: Secondary | ICD-10-CM | POA: Diagnosis not present

## 2023-12-28 DIAGNOSIS — M25572 Pain in left ankle and joints of left foot: Secondary | ICD-10-CM | POA: Diagnosis not present

## 2024-01-01 DIAGNOSIS — M25572 Pain in left ankle and joints of left foot: Secondary | ICD-10-CM | POA: Diagnosis not present

## 2024-01-08 ENCOUNTER — Ambulatory Visit (INDEPENDENT_AMBULATORY_CARE_PROVIDER_SITE_OTHER)

## 2024-01-08 DIAGNOSIS — J309 Allergic rhinitis, unspecified: Secondary | ICD-10-CM | POA: Diagnosis not present

## 2024-01-09 DIAGNOSIS — M25572 Pain in left ankle and joints of left foot: Secondary | ICD-10-CM | POA: Diagnosis not present

## 2024-01-10 ENCOUNTER — Ambulatory Visit (INDEPENDENT_AMBULATORY_CARE_PROVIDER_SITE_OTHER): Admitting: Podiatry

## 2024-01-10 VITALS — Ht 66.0 in | Wt 301.0 lb

## 2024-01-10 DIAGNOSIS — M722 Plantar fascial fibromatosis: Secondary | ICD-10-CM

## 2024-01-10 DIAGNOSIS — M62462 Contracture of muscle, left lower leg: Secondary | ICD-10-CM

## 2024-01-10 DIAGNOSIS — M7662 Achilles tendinitis, left leg: Secondary | ICD-10-CM

## 2024-01-10 NOTE — Progress Notes (Signed)
  Subjective:  Patient ID: Shelia Bowers, female    DOB: 1970-08-18,  MRN: [id number]  Chief Complaint  Patient presents with   Post-op Follow-up    Rm 2 Patient is here for final post-op f/u for achilles tendonitis of left leg. Patient states only mild pain (1 on scale of 1-10) after completing physical therapy. Patient has no additional concerns at this time.    53 y.o. female returns for post-op check.  Please so far with excellent progress she has a few appointments of PT left  Review of Systems: Negative except as noted in the HPI. Denies N/V/F/Ch.   Objective:  There were no vitals filed for this visit. Body mass index is 48.58 kg/m. Constitutional Well developed. Well nourished.  Vascular Foot warm and well perfused. Capillary refill normal to all digits.  Calf is soft and supple, no posterior calf or knee pain, negative Homans' sign  Neurologic Normal speech. Oriented to person, place, and time. Epicritic sensation to light touch grossly present bilaterally.  Dermatologic Incision well-healed and it is not not hypertrophic  Orthopedic: She has no pain to palpation noted about the surgical site.  Good range of motion Achilles.  5 out of 5 strength   Assessment:   1. Achilles tendinitis, left leg   2. Plantar fasciitis   3. Gastrocnemius equinus of left lower extremity      Plan:  Patient was evaluated and treated and all questions answered.  S/p foot surgery left - Doing very well.  Weightbearing as tolerated in shoe gear.  Careful with any impact exercise or activity until 6 months.  Follow-up as needed if further issues.   Return if symptoms worsen or fail to improve.

## 2024-01-12 DIAGNOSIS — M25572 Pain in left ankle and joints of left foot: Secondary | ICD-10-CM | POA: Diagnosis not present

## 2024-01-16 ENCOUNTER — Encounter: Payer: Self-pay | Admitting: Allergy and Immunology

## 2024-01-16 ENCOUNTER — Ambulatory Visit: Admitting: Allergy and Immunology

## 2024-01-16 VITALS — BP 150/88 | HR 88 | Temp 98.0°F

## 2024-01-16 DIAGNOSIS — J301 Allergic rhinitis due to pollen: Secondary | ICD-10-CM

## 2024-01-16 DIAGNOSIS — K219 Gastro-esophageal reflux disease without esophagitis: Secondary | ICD-10-CM | POA: Diagnosis not present

## 2024-01-16 DIAGNOSIS — J3089 Other allergic rhinitis: Secondary | ICD-10-CM

## 2024-01-16 DIAGNOSIS — J4550 Severe persistent asthma, uncomplicated: Secondary | ICD-10-CM

## 2024-01-16 MED ORDER — NYSTATIN 100000 UNIT/ML MT SUSP: 5.0000 mL | Freq: Four times a day (QID) | OROMUCOSAL | 1 refills | Status: DC

## 2024-01-16 MED ORDER — BREZTRI AEROSPHERE 160-9-4.8 MCG/ACT IN AERO: 2.0000 | INHALATION_SPRAY | Freq: Two times a day (BID) | RESPIRATORY_TRACT | 1 refills | Status: DC

## 2024-01-16 MED ORDER — OMEPRAZOLE 40 MG PO CPDR: 40.0000 mg | DELAYED_RELEASE_CAPSULE | Freq: Two times a day (BID) | ORAL | 1 refills | Status: DC

## 2024-01-16 MED ORDER — TEZEPELUMAB-EKKO 210 MG/1.91ML ~~LOC~~ SOSY
210.0000 mg | PREFILLED_SYRINGE | SUBCUTANEOUS | Status: AC
  Administered 2024-01-16: 210 mg via SUBCUTANEOUS

## 2024-01-16 MED ORDER — FLUTICASONE PROPIONATE 50 MCG/ACT NA SUSP: 2.0000 | Freq: Every day | NASAL | 1 refills | Status: DC

## 2024-01-16 MED ORDER — SPACER/AERO-HOLDING CHAMBERS DEVI: 1.0000 | 1 refills | Status: AC

## 2024-01-16 MED ORDER — AIRSUPRA 90-80 MCG/ACT IN AERO: 2.0000 | INHALATION_SPRAY | RESPIRATORY_TRACT | 1 refills | Status: DC | PRN

## 2024-01-16 NOTE — Patient Instructions (Addendum)
  1.  Treat and prevent inflammation:  A.  Breztri  - 2 inhalations 2 times per day w/ spacer (empty lungs) B.  Flonase  - 1-2 sprays each nostril 3-7 times per week  C.  Immunotherapy  D. Tezepelumab  injection today and every 4 weeks   2. Treat reflux induced inflammation of airway:   A. Omeprazole  40 mg - 1 tablet 1-2 times per day  B. Minimize caffeine and chocolate   C. Replace throat clearing with swallowing/ drinking maneuver  3.  Prevent thrush:   A. Nystatin  - 5 mls swish, gargle swallow after Trelegy use  4. If needed:   A. Antihistamine  B. Nasal saline  C. AIRSUPRA  - 2 inhalations every 6 hours  5. Blood - CBC w/d, IgE  6. Return to clinic 4 weeks or earlier if problem  7. Influenza = Tamiflu. Covid = Paxlovid

## 2024-01-16 NOTE — Progress Notes (Unsigned)
 Immunotherapy   Patient Details  Name: SAMERA MACY MRN: [id number] Date of Birth: [birth year]  01/16/2024  [name] started injections for  Tezspire  Following schedule: Every twenty eight days.  Frequency:Every four weeks.  Epi-Pen:Epi-Pen Available  but not needed for this biologic.  Consent signed in office and patient instructions given. Patient sat in room six for fifteen minutes without an issue.    Santana DELENA Eck 01/16/2024, 5:01 PM

## 2024-01-16 NOTE — Progress Notes (Unsigned)
 [place]   Follow-up Note  Referring Provider: Derick Leita POUR, MD Primary Provider: Derick Leita POUR, MD Date of Office Visit: 01/16/2024  Subjective:   Shelia Bowers (DOB: September 22, 1970) is a 53 y.o. female who [facility] 01[facility]16/2024 in re-evaluation of the following:  HPI: Shelia Bowers returns to this clinic in evaluation of asthma, allergic rhinoconjunctivitis, LPR. She was last seen one month ago on 12/19/2023 by Dr. Kozlow.  Shelia Bowers says that her symptoms have generally not improved since her visit one month ago. She has wheezing while sitting around that is worse with laying down or at night. She wakes up each night with wheezing and has daily coughing fits, during which she feels short of breath. Outside of these coughing episodes she does not feel short of breath. The patient has been using her Trelegy inhaler once daily along with Allegra  (xyzal  caused drowsiness).  The patient has had persistent nasal congestion with some mucus production for over a month now. She has used fluticasone  for 3-4 days at a time without much difference so she does not often use it. The patient has to blow her nose too much. On days that she does not take Allegra , her nasal symptoms are worse.  Shelia Bowers says that her reflux seems to be controlled, but she loses her voice while talking in meetings for work and has throat clearing still. She denies indigestion, bad taste in her mouth, changes after mealtime or morning. The patient has not been on a biologic.   Allergies as of 01/16/2024       Reactions   Azithromycin Hives   Clarithromycin Hives   Macrolides And Ketolides Hives   Medroxyprogesterone Other (See Comments)   Mono like sx, fatigue, nausea, vomiting Mono like sx, fatigue, nausea, vomiting Mono like sx, fatigue, nausea, vomiting Mono like sx, fatigue, nausea, vomiting   Rizatriptan Palpitations        Medication  List    Airsupra  90-80 MCG/ACT Aero Generic drug: Albuterol -Budesonide Inhale 2 Inhalations into the lungs every 4 (four) hours as needed.   albuterol  108 (90 Base) MCG/ACT inhaler Commonly known as: VENTOLIN  HFA Inhale 2 puffs into the lungs every 4 (four) hours as needed for wheezing or shortness of breath.   EPINEPHrine  0.3 mg/0.3 mL Soaj injection Commonly known as: Auvi-Q  Inject 0.3 mg into the muscle as needed for anaphylaxis.   estradiol 0.5 MG tablet Commonly known as: ESTRACE Take 0.5 mg by mouth daily.   fexofenadine  180 MG tablet Commonly known as: Allegra  Allergy Take 1 tablet (180 mg total) by mouth daily.   fluticasone  50 MCG/ACT nasal spray Commonly known as: FLONASE  Place 2 sprays into both nostrils daily. 1-2 sprays in each nostril during upper airway symptoms   fluticasone -salmeterol 100-50 MCG/ACT Aepb Commonly known as: ADVAIR Inhale 1 puff into the lungs 2 (two) times daily.   gabapentin  300 MG capsule Commonly known as: NEURONTIN  Take 1 capsule (300 mg total) by mouth 3 (three) times daily for 7 days.   levocetirizine 5 MG tablet Commonly known as: XYZAL  Take 1 tablet (5 mg total) by mouth daily as needed for allergies (Can take an extra dose during flare ups.).   meloxicam  15 MG tablet Commonly known as: Mobic  Take 1 tablet (15 mg total) by mouth daily.   multivitamin capsule Take 1 capsule by mouth daily.   nystatin  100000 UNIT/ML suspension Commonly known as: MYCOSTATIN  Take 5  mLs (500,000 Units total) by mouth 4 (four) times daily.   omeprazole  20 MG tablet Commonly known as: PRILOSEC OTC Take by mouth.   omeprazole  40 MG capsule Commonly known as: PRILOSEC Take 1 capsule (40 mg total) by mouth 2 (two) times daily.   sertraline 50 MG tablet Commonly known as: ZOLOFT take 1.5 tablet oral daily   Trelegy Ellipta  100-62.5-25 MCG/ACT Aepb Generic drug: Fluticasone -Umeclidin-Vilant Inhale 1 Inhalation into the lungs every morning.     Past Medical History:  Diagnosis Date   Asthma     Past Surgical History:  Procedure Laterality Date   CARPAL TUNNEL RELEASE Bilateral    TUBAL LIGATION      Review of systems negative except as noted in HPI / PMHx or noted below:  Review of Systems  Constitutional:  Negative for fever.  HENT:  Positive for congestion. Negative for sore throat.        Positive for vocal hoarseness/losing her voice, rhinorrhea  Eyes:  Negative for discharge and redness.  Respiratory:  Positive for cough, shortness of breath (during coughing spells) and wheezing.   Cardiovascular:  Negative for chest pain.  Skin:  Negative for itching and rash.  Neurological:  Negative for dizziness and headaches.  All other systems reviewed and are negative.    Objective:   Vitals:   01/16/24 1554  BP: (!) 148/92  Pulse: 88  Temp: 98 F (36.7 C)  SpO2: 96%          Physical Exam Constitutional:      Appearance: Normal appearance.  HENT:     Head: Normocephalic and atraumatic.     Right Ear: Tympanic membrane normal.     Left Ear: Tympanic membrane normal.     Nose: Nose normal. No congestion.     Mouth/Throat:     Mouth: Mucous membranes are moist.     Comments: Mild vocal hoarseness Cardiovascular:     Rate and Rhythm: Normal rate and regular rhythm.  Pulmonary:     Effort: Pulmonary effort is normal.     Breath sounds: Wheezing (inspiratory and expiratory, diffuse) present.  Musculoskeletal:        General: Normal range of motion.     Cervical back: Normal range of motion.  Skin:    General: Skin is warm.     Findings: No rash.  Neurological:     General: No focal deficit present.     Mental Status: She is alert.  Psychiatric:        Mood and Affect: Mood normal.        Behavior: Behavior normal.     Diagnostics: Spirometry was performed and demonstrated an FEV1 of 2.44 at 86 % of predicted, slightly improved from 81% a month ago.  Assessment and Plan:   1. Not well  controlled severe persistent asthma   2. Perennial allergic rhinitis   3. Seasonal allergic rhinitis due to pollen   4. LPRD (laryngopharyngeal reflux disease)    1.  Treat and prevent inflammation:  A.  Breztri  - 2 inhalations 2 times per day w/ spacer (empty lungs) B.  Flonase  - 1-2 sprays each nostril 3-7 times per week  C.  Immunotherapy  D. Tezepelumab  injection today and every 4 weeks   2. Treat reflux induced inflammation of airway:   A. Omeprazole  40 mg - 1 tablet 1-2 times per day  B. Minimize caffeine and chocolate   C. Replace throat clearing with swallowing/ drinking maneuver  3.  Prevent  thrush:   A. Nystatin  - 5 mls swish, gargle swallow after Trelegy use  4. If needed:   A. Antihistamine  B. Nasal saline  C. AIRSUPRA  - 2 inhalations every 6 hours  5. Blood - CBC w/d, IgE  6. Return to clinic 4 weeks or earlier if problem  7. Influenza = Tamiflu. Covid = Paxlovid   Shelia Bowers presents for one month follow up for her persistent asthma, rhinitis, and LPR. The patient has not found improvement over the past month regarding congestion, mucus production, and wheezing/coughing despite trelegy and being treated with systemic steroids s/p viral illness 6 weeks ago. She also has throat clearing and loses her voice when talking. On examination she has inspiratory and expiratory wheezing diffusely which induces coughing. Since she was treated with depomedrol and prednisone  3-4 weeks ago and has a long term history of uncontrolled asthma and rhinitis, I would like to start her on tezepelumab . For this we will obtain CBC w/ diff to measure eosinophil level. I will also switch her from trelegy to Breztri  with a spacer to decrease powder irritation of the larynx, which I suspect is contributing to her throat clearing and vocal hoarseness. She will start consistent daily use of nasal spray after we discussed that this is a long term medication and keep up oral antihistamine each day. She  will return in four weeks.  Donnice Mutter, MS4 [facility]  I have provided oversight and direction for Donnice Mutter, [employer] [employer] [employer] med student, concerning the care of [employer] during today's visit.  In review, she has persistent inflammation of both her upper and lower airway in the face of using anti-inflammatory agents directed against her respiratory tract and has failed medical therapy and we will now start her on a biologic agent using anti-TSL P antibody.  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  I will see her back in this clinic in 4 weeks to assess her response to this approach.  Camellia Denis, MD Allergy / Immunology [facility]

## 2024-01-17 ENCOUNTER — Encounter: Payer: Self-pay | Admitting: Allergy and Immunology

## 2024-01-17 DIAGNOSIS — M25572 Pain in left ankle and joints of left foot: Secondary | ICD-10-CM | POA: Diagnosis not present

## 2024-01-19 ENCOUNTER — Other Ambulatory Visit: Payer: Self-pay | Admitting: Podiatry

## 2024-01-19 DIAGNOSIS — M25572 Pain in left ankle and joints of left foot: Secondary | ICD-10-CM | POA: Diagnosis not present

## 2024-01-19 LAB — CBC WITH DIFFERENTIAL/PLATELET
Basophils Absolute: 0.1 x10E3/uL (ref 0.0–0.2)
Basos: 1 %
EOS (ABSOLUTE): 0.5 x10E3/uL — ABNORMAL HIGH (ref 0.0–0.4)
Eos: 6 %
Hematocrit: 42.7 % (ref 34.0–46.6)
Hemoglobin: 13.8 g/dL (ref 11.1–15.9)
Immature Grans (Abs): 0 x10E3/uL (ref 0.0–0.1)
Immature Granulocytes: 0 %
Lymphocytes Absolute: 2.6 x10E3/uL (ref 0.7–3.1)
Lymphs: 30 %
MCH: 30.8 pg (ref 26.6–33.0)
MCHC: 32.3 g/dL (ref 31.5–35.7)
MCV: 95 fL (ref 79–97)
Monocytes Absolute: 0.6 x10E3/uL (ref 0.1–0.9)
Monocytes: 7 %
Neutrophils Absolute: 4.8 x10E3/uL (ref 1.4–7.0)
Neutrophils: 55 %
Platelets: 228 x10E3/uL (ref 150–450)
RBC: 4.48 x10E6/uL (ref 3.77–5.28)
RDW: 13.5 % (ref 11.7–15.4)
WBC: 8.5 x10E3/uL (ref 3.4–10.8)

## 2024-01-19 LAB — IGE: IgE (Immunoglobulin E), Serum: 45 [IU]/mL (ref 6–495)

## 2024-01-22 ENCOUNTER — Ambulatory Visit: Payer: Self-pay | Admitting: Allergy and Immunology

## 2024-01-22 DIAGNOSIS — M25572 Pain in left ankle and joints of left foot: Secondary | ICD-10-CM | POA: Diagnosis not present

## 2024-01-24 MED ORDER — TEZSPIRE 210 MG/1.91ML ~~LOC~~ SOAJ
210.0000 mg | SUBCUTANEOUS | 11 refills | Status: AC
  Filled 2024-01-25: qty 1.91, 28d supply, fill #0
  Filled 2024-02-20: qty 1.91, 28d supply, fill #1
  Filled 2024-03-26: qty 1.91, 28d supply, fill #2
  Filled 2024-04-23: qty 1.91, 28d supply, fill #3
  Filled 2024-05-21: qty 1.91, 28d supply, fill #4
  Filled 2024-06-18 – 2024-06-19 (×2): qty 1.91, 28d supply, fill #5
  Filled 2024-07-15 – 2024-07-17 (×2): qty 1.91, 28d supply, fill #6

## 2024-01-24 NOTE — Telephone Encounter (Signed)
-----   Message from ERIC J KOZLOW sent at 01/22/2024  7:07 AM EDT ----- Please inform Shelia Bowers that her blood test have returned and she does have a high eosinophil count which goes along with her immune dysfunction and she also has an IgE level of 45.  As she knows we  started her on tezepelumab  injections and hopefully her insurance will allow her to continue on these injections and not use an anti-IgE or anti-IL-5 agent.  Shelia Bowers will work through this issue. ----- Message ----- From: Interface, [facility] Lab Results In Sent: 01/17/2024   7:37 AM EDT To: Shelia JINNY Denis, MD

## 2024-01-24 NOTE — Telephone Encounter (Signed)
 Called patient and advised approval, copay card and submit to [facility] for Tezspire . Instructions given on delivery,storage and dosing for same

## 2024-01-25 ENCOUNTER — Other Ambulatory Visit: Payer: Self-pay

## 2024-01-25 ENCOUNTER — Other Ambulatory Visit (HOSPITAL_COMMUNITY): Payer: Self-pay

## 2024-01-25 NOTE — Progress Notes (Signed)
 Specialty Pharmacy Initial Fill Coordination Note  Shelia Bowers is a 53 y.o. female contacted today regarding initial fill of specialty medication(s) Tezepelumab -ekko (Tezspire )   Patient requested Delivery   Delivery date: 01/31/24   Verified address: [address]   [place] [address]   Medication will be filled on 08/05.   Patient is aware of $0.00 copayment.

## 2024-01-25 NOTE — Progress Notes (Signed)
 Specialty Pharmacy Initiation Note   Shelia Bowers is a 53 y.o. female who will be followed by the specialty pharmacy service for RxSp Asthma/COPD    Review of administration, indication, effectiveness, safety, potential side effects, storage/disposable, and missed dose instructions occurred today for patient's specialty medication(s) Tezepelumab -ekko (Tezspire )     Patient/Caregiver did not have any additional questions or concerns.   Patient's therapy is appropriate to: Initiate    Goals Addressed             This Visit's Progress    Reduce disease symptoms including coughing and shortness of breath       Patient is initiating therapy. Patient will maintain adherence and avoid flare triggers         Vedh Ptacek M Lameka Disla Specialty Pharmacist

## 2024-01-26 DIAGNOSIS — M25572 Pain in left ankle and joints of left foot: Secondary | ICD-10-CM | POA: Diagnosis not present

## 2024-01-30 ENCOUNTER — Other Ambulatory Visit: Payer: Self-pay

## 2024-02-07 ENCOUNTER — Ambulatory Visit (INDEPENDENT_AMBULATORY_CARE_PROVIDER_SITE_OTHER)

## 2024-02-07 DIAGNOSIS — J309 Allergic rhinitis, unspecified: Secondary | ICD-10-CM | POA: Diagnosis not present

## 2024-02-15 ENCOUNTER — Ambulatory Visit (INDEPENDENT_AMBULATORY_CARE_PROVIDER_SITE_OTHER): Admitting: Allergy and Immunology

## 2024-02-15 ENCOUNTER — Encounter: Payer: Self-pay | Admitting: Allergy and Immunology

## 2024-02-15 VITALS — BP 162/102 | HR 88 | Resp 16 | Ht 65.5 in | Wt 311.0 lb

## 2024-02-15 DIAGNOSIS — J3089 Other allergic rhinitis: Secondary | ICD-10-CM

## 2024-02-15 DIAGNOSIS — J301 Allergic rhinitis due to pollen: Secondary | ICD-10-CM | POA: Diagnosis not present

## 2024-02-15 DIAGNOSIS — K219 Gastro-esophageal reflux disease without esophagitis: Secondary | ICD-10-CM

## 2024-02-15 DIAGNOSIS — J4550 Severe persistent asthma, uncomplicated: Secondary | ICD-10-CM

## 2024-02-15 NOTE — Patient Instructions (Addendum)
  1.  Treat and prevent inflammation:  A.  Breztri  - 2 inhalations 1-2 times per day w/ spacer (empty lungs) B.  Flonase  - 1-2 sprays each nostril 3-7 times per week  C.  Immunotherapy  D.  Tezepelumab  injections every 4 weeks  2. Treat reflux induced inflammation of airway:   A. Omeprazole  40 mg - 1 tablet 1-2 times per day  B. Minimize caffeine and chocolate   C. Replace throat clearing with swallowing/ drinking maneuver  3.  Prevent thrush:   A. Nystatin  - 5 mls swish, gargle swallow after Breztri  use  4. If needed:   A. Antihistamine  B. Nasal saline  C. AIRSUPRA  - 2 inhalations every 6 hours  5. Return to clinic December 2025 or earlier if problem  6. Influenza = Tamiflu. Covid = Paxlovid

## 2024-02-15 NOTE — Progress Notes (Signed)
 [place] - [place] - [place] - [place] - [place]   Follow-up Note  Referring Provider: Derick Leita POUR, MD Primary Provider: Derick Leita POUR, MD Date of Office Visit: 02/15/2024  Subjective:   Shelia Bowers (DOB: 06/20/1971) is a 53 y.o. female who [facility] in re-evaluation of the following:  HPI: Shelia Bowers returns to this clinic in evaluation of asthma, allergic rhinoconjunctivitis, LPR.  I last saw her in this clinic 16 January 2024.  During her last visit we started her on tezepelumab  injections and she has had 2 injections to date.  She can already tell a significant difference regarding control of her asthma.  She has not had to use as bronchodilator at all.  Her wheezing has resolved, her coughing has resolved, and she can exert herself much better.  She has had very little problems with her nose.  She thinks her reflux is under very good control at this point in time.  Allergies as of 02/15/2024       Reactions   Azithromycin Hives   Clarithromycin Hives   Macrolides And Ketolides Hives   Medroxyprogesterone Other (See Comments)   Mono like sx, fatigue, nausea, vomiting Mono like sx, fatigue, nausea, vomiting Mono like sx, fatigue, nausea, vomiting Mono like sx, fatigue, nausea, vomiting   Rizatriptan Palpitations        Medication List    Airsupra  90-80 MCG/ACT Aero Generic drug: Albuterol -Budesonide Inhale 2 Inhalations into the lungs every 4 (four) hours as needed.   Breztri  Aerosphere 160-9-4.8 MCG/ACT Aero inhaler Generic drug: budesonide-glycopyrrolate-formoterol Inhale 2 puffs into the lungs in the morning and at bedtime.   EPINEPHrine  0.3 mg/0.3 mL Soaj injection Commonly known as: Auvi-Q  Inject 0.3 mg into the muscle as needed for anaphylaxis.   fexofenadine  180 MG tablet Commonly known as: Allegra  Allergy Take 1 tablet (180 mg total) by mouth daily.   fluticasone  50 MCG/ACT nasal  spray Commonly known as: FLONASE  Place 2 sprays into both nostrils daily. 1-2 sprays in each nostril during upper airway symptoms   meloxicam  15 MG tablet Commonly known as: Mobic  Take 1 tablet (15 mg total) by mouth daily.   multivitamin capsule Take 1 capsule by mouth daily.   nystatin  100000 UNIT/ML suspension Commonly known as: MYCOSTATIN  Take 5 mLs (500,000 Units total) by mouth 4 (four) times daily.   omeprazole  40 MG capsule Commonly known as: PRILOSEC Take 1 capsule (40 mg total) by mouth 2 (two) times daily.   progesterone 100 MG capsule Commonly known as: PROMETRIUM Take 100 mg by mouth daily.   sertraline 100 MG tablet Commonly known as: ZOLOFT Take 150 mg by mouth daily.   Spacer/Aero-Holding [employer] 1 Device by Does not apply route as directed.   Tezspire  210 MG/1. Soaj Generic drug: Tezepelumab -ekko Inject 210 mg into the skin every 28 (twenty-eight) days.    Past Medical History:  Diagnosis Date   Asthma     Past Surgical History:  Procedure Laterality Date   CARPAL TUNNEL RELEASE Bilateral    TUBAL LIGATION      Review of systems negative except as noted in HPI / PMHx or noted below:  Review of Systems  Constitutional: Negative.   HENT: Negative.    Eyes: Negative.   Respiratory: Negative.    Cardiovascular: Negative.   Gastrointestinal: Negative.   Genitourinary: Negative.   Musculoskeletal: Negative.   Skin: Negative.   Neurological: Negative.   Endo/Heme/Allergies: Negative.   Psychiatric/Behavioral:  Negative.       Objective:   Vitals:   02/15/24 1537  BP: (!) 162/102  Pulse: 88  Resp: 16  SpO2: 96%   Height: 5' 5.5 (166.4 cm)  Weight: (!) 311 lb (141.1 kg)   Physical Exam Constitutional:      Appearance: She is not diaphoretic.  HENT:     Head: Normocephalic.     Right Ear: Tympanic membrane, ear canal and external ear normal.     Left Ear: Tympanic membrane, ear canal and external ear normal.     Nose:  Nose normal. No mucosal edema or rhinorrhea.     Mouth/Throat:     Pharynx: Uvula midline. No oropharyngeal exudate.  Eyes:     Conjunctiva/sclera: Conjunctivae normal.  Neck:     Thyroid: No thyromegaly.     Trachea: Trachea normal. No tracheal tenderness or tracheal deviation.  Cardiovascular:     Rate and Rhythm: Normal rate and regular rhythm.     Heart sounds: Normal heart sounds, S1 normal and S2 normal. No murmur heard. Pulmonary:     Effort: No respiratory distress.     Breath sounds: Normal breath sounds. No stridor. No wheezing or rales.  Lymphadenopathy:     Head:     Right side of head: No tonsillar adenopathy.     Left side of head: No tonsillar adenopathy.     Cervical: No cervical adenopathy.  Skin:    Findings: No erythema or rash.     Nails: There is no clubbing.  Neurological:     Mental Status: She is alert.     Diagnostics: Spirometry was performed and demonstrated an FEV1 of 2.57 at 92 % of predicted.   Assessment and Plan:   1. Asthma, severe persistent, well-controlled   2. Perennial allergic rhinitis   3. Seasonal allergic rhinitis due to pollen   4. LPRD (laryngopharyngeal reflux disease)    1.  Treat and prevent inflammation:  A.  Breztri  - 2 inhalations 1-2 times per day w/ spacer (empty lungs) B.  Flonase  - 1-2 sprays each nostril 3-7 times per week  C.  Immunotherapy  D.  Tezepelumab  injections every 4 weeks  2. Treat reflux induced inflammation of airway:   A. Omeprazole  40 mg - 1 tablet 1-2 times per day  B. Minimize caffeine and chocolate   C. Replace throat clearing with swallowing/ drinking maneuver  3.  Prevent thrush:   A. Nystatin  - 5 mls swish, gargle swallow after Breztri  use  4. If needed:   A. Antihistamine  B. Nasal saline  C. AIRSUPRA  - 2 inhalations every 6 hours  5. Return to clinic December 2025 or earlier if problem  6. Influenza = Tamiflu. Covid = Paxlovid   Shelia Bowers is doing better and this is probably a  reflection of using her anti-TSLP antibody.  I suspect will be able to dramatically consolidate both her Breztri  and her Flonase  in the face of utilizing this biologic agent sometime throughout 2025.  She will continue on immunotherapy and she will continue to also treat her reflux as noted above.  I will see her back in this clinic in December 2025 or earlier if there is a problem.  Camellia Denis, MD Allergy / Immunology [facility]

## 2024-02-19 ENCOUNTER — Encounter: Payer: Self-pay | Admitting: Allergy and Immunology

## 2024-02-20 ENCOUNTER — Ambulatory Visit: Payer: Self-pay | Admitting: Family Medicine

## 2024-02-20 ENCOUNTER — Other Ambulatory Visit: Payer: Self-pay

## 2024-02-20 ENCOUNTER — Ambulatory Visit (INDEPENDENT_AMBULATORY_CARE_PROVIDER_SITE_OTHER): Admitting: Family Medicine

## 2024-02-20 ENCOUNTER — Encounter (INDEPENDENT_AMBULATORY_CARE_PROVIDER_SITE_OTHER): Payer: Self-pay

## 2024-02-20 ENCOUNTER — Encounter: Payer: Self-pay | Admitting: Family Medicine

## 2024-02-20 VITALS — BP 166/98 | HR 95 | Temp 98.3°F | Ht 65.5 in | Wt 312.1 lb

## 2024-02-20 DIAGNOSIS — N912 Amenorrhea, unspecified: Secondary | ICD-10-CM | POA: Diagnosis not present

## 2024-02-20 DIAGNOSIS — J4550 Severe persistent asthma, uncomplicated: Secondary | ICD-10-CM | POA: Diagnosis not present

## 2024-02-20 DIAGNOSIS — J3089 Other allergic rhinitis: Secondary | ICD-10-CM

## 2024-02-20 DIAGNOSIS — Z6841 Body Mass Index (BMI) 40.0 and over, adult: Secondary | ICD-10-CM

## 2024-02-20 DIAGNOSIS — K219 Gastro-esophageal reflux disease without esophagitis: Secondary | ICD-10-CM

## 2024-02-20 DIAGNOSIS — Z79899 Other long term (current) drug therapy: Secondary | ICD-10-CM | POA: Diagnosis not present

## 2024-02-20 DIAGNOSIS — J309 Allergic rhinitis, unspecified: Secondary | ICD-10-CM | POA: Insufficient documentation

## 2024-02-20 DIAGNOSIS — I10 Essential (primary) hypertension: Secondary | ICD-10-CM | POA: Diagnosis not present

## 2024-02-20 DIAGNOSIS — F32A Depression, unspecified: Secondary | ICD-10-CM

## 2024-02-20 DIAGNOSIS — R5382 Chronic fatigue, unspecified: Secondary | ICD-10-CM | POA: Diagnosis not present

## 2024-02-20 DIAGNOSIS — R5383 Other fatigue: Secondary | ICD-10-CM | POA: Insufficient documentation

## 2024-02-20 DIAGNOSIS — E7800 Pure hypercholesterolemia, unspecified: Secondary | ICD-10-CM | POA: Diagnosis not present

## 2024-02-20 DIAGNOSIS — G4700 Insomnia, unspecified: Secondary | ICD-10-CM

## 2024-02-20 DIAGNOSIS — K9041 Non-celiac gluten sensitivity: Secondary | ICD-10-CM

## 2024-02-20 DIAGNOSIS — F411 Generalized anxiety disorder: Secondary | ICD-10-CM

## 2024-02-20 LAB — COMPREHENSIVE METABOLIC PANEL WITH GFR
ALT: 40 U/L — ABNORMAL HIGH (ref 0–35)
AST: 28 U/L (ref 0–37)
Albumin: 4.4 g/dL (ref 3.5–5.2)
Alkaline Phosphatase: 61 U/L (ref 39–117)
BUN: 13 mg/dL (ref 6–23)
CO2: 31 meq/L (ref 19–32)
Calcium: 9.1 mg/dL (ref 8.4–10.5)
Chloride: 101 meq/L (ref 96–112)
Creatinine, Ser: 0.82 mg/dL (ref 0.40–1.20)
GFR: 81.35 mL/min (ref >60.00)
Glucose, Bld: 79 mg/dL (ref 70–99)
Potassium: 3.9 meq/L (ref 3.5–5.1)
Sodium: 143 meq/L (ref 135–145)
Total Bilirubin: 0.4 mg/dL (ref 0.2–1.2)
Total Protein: 7.4 g/dL (ref 6.0–8.3)

## 2024-02-20 LAB — LIPID PANEL
Cholesterol: 284 mg/dL — ABNORMAL HIGH (ref 0–200)
HDL: 50.2 mg/dL (ref >39.00)
LDL Cholesterol: 190 mg/dL — ABNORMAL HIGH (ref 0–99)
NonHDL: 233.57
Total CHOL/HDL Ratio: 6
Triglycerides: 216 mg/dL — ABNORMAL HIGH (ref 0.0–149.0)
VLDL: 43.2 mg/dL — ABNORMAL HIGH (ref 0.0–40.0)

## 2024-02-20 LAB — CBC WITH DIFFERENTIAL/PLATELET
Basophils Absolute: 0 K/uL (ref 0.0–0.1)
Basophils Relative: 0.6 % (ref 0.0–3.0)
Eosinophils Absolute: 0.2 K/uL (ref 0.0–0.7)
Eosinophils Relative: 2.6 % (ref 0.0–5.0)
HCT: 41.5 % (ref 36.0–46.0)
Hemoglobin: 13.8 g/dL (ref 12.0–15.0)
Lymphocytes Relative: 31.5 % (ref 12.0–46.0)
Lymphs Abs: 2.6 K/uL (ref 0.7–4.0)
MCHC: 33.2 g/dL (ref 30.0–36.0)
MCV: 92.2 fl (ref 78.0–100.0)
Monocytes Absolute: 0.7 K/uL (ref 0.1–1.0)
Monocytes Relative: 8.4 % (ref 3.0–12.0)
Neutro Abs: 4.8 K/uL (ref 1.4–7.7)
Neutrophils Relative %: 56.9 % (ref 43.0–77.0)
Platelets: 246 K/uL (ref 150.0–400.0)
RBC: 4.5 Mil/uL (ref 3.87–5.11)
RDW: 13.8 % (ref 11.5–15.5)
WBC: 8.4 K/uL (ref 4.0–10.5)

## 2024-02-20 LAB — FOLLICLE STIMULATING HORMONE: FSH: 17.1 m[IU]/mL

## 2024-02-20 LAB — VITAMIN D 25 HYDROXY (VIT D DEFICIENCY, FRACTURES): VITD: 39.51 ng/mL (ref 30.00–100.00)

## 2024-02-20 LAB — VITAMIN B12: Vitamin B-12: 477 pg/mL (ref 211–911)

## 2024-02-20 LAB — TSH: TSH: 1.9 u[IU]/mL (ref 0.35–5.50)

## 2024-02-20 LAB — LUTEINIZING HORMONE: LH: 21.95 m[IU]/mL

## 2024-02-20 MED ORDER — LOSARTAN POTASSIUM-HCTZ 50-12.5 MG PO TABS
1.0000 | ORAL_TABLET | Freq: Every day | ORAL | 1 refills | Status: DC
Start: 2024-02-20 — End: 2024-03-14

## 2024-02-20 NOTE — Progress Notes (Signed)
 Subjective:    Patient ID: Shelia Bowers, female    DOB: [birth year], 53 y.o.   MRN: [id number]  HPI  Wt Readings from Last 3 Encounters:  02/20/24 (!) 312 lb 2 oz (141.6 kg)  02/15/24 (!) 311 lb (141.1 kg)  01/10/24 (!) 301 lb (136.5 kg)   51.15 kg/m  Vitals:   02/20/24 1159  BP: (!) 166/98  Pulse: 95  Temp: 98.3 F (36.8 C)  SpO2: 97%    Pt presents to establish for primary care  C/o  High blood pressure   Blood pressure has been high at home and other offices   BP Readings from Last 3 Encounters:  02/20/24 (!) 166/98  02/15/24 (!) 162/102  01/16/24 (!) 150/88   Pulse Readings from Last 3 Encounters:  02/20/24 95  02/15/24 88  01/16/24 88    Used to see Comer Alder in [facility] and it is too hard to get there   Works a home  This is closer   Works at home   Works for tech/legal  Very stressful  Loves working at home  Used to be a CMA   Medical history  Dr Maurilio is her allergist   Asthma -severe persistent -got worse after uri last year   Breztri  Airsupra  (albuterol  budesonide)  Albuterol   Tezpire injection -has made a big difference   Allergies  Flonase   Allegra   Has epi pen- for her allergy injections once a month    Anxiety/depression  Depression was 20 y ago  Anxiety - long time (got worse when she cared for father)  Sertraline 150 mg daily - really helps   Hyperlipidemia Up and down in past  No meds   Last labs were dec 2024   GERD- history of LPR  Takes omeprazole  40 mg daily    Past HRT=not taking right now    Did depo provera years ago  Took OCP in the past   No period since 2020  Does not have gyn provider right now     [facility]  Goes to [facility] for mammo   Fam history  Father- HTN (died at 72)    Surg  Had plantar fascial release in April - podiatry  Also lengthened calf/achilles  Just finished PT  Gradual improvement  Finally able to start walking more   Elevated blood  pressure -trending high   Low energy         02/20/2024   12:16 PM  Depression screen PHQ 2/9  Decreased Interest 0  Down, Depressed, Hopeless 0  PHQ - 2 Score 0  Altered sleeping 0  Tired, decreased energy 2  Change in appetite 1  Feeling bad or failure about yourself  0  Trouble concentrating 0  Moving slowly or fidgety/restless 0  Suicidal thoughts 0  PHQ-9 Score 3  Difficult doing work/chores Not difficult at all      02/20/2024   12:16 PM  GAD 7 : Generalized Anxiety Score  Nervous, Anxious, on Edge 0  Control/stop worrying 0  Worry too much - different things 0  Trouble relaxing 0  Restless 0  Easily annoyed or irritable 1  Afraid - awful might happen 0  Total GAD 7 Score 1  Anxiety Difficulty Not difficult at all       Patient Active Problem List   Diagnosis Date Noted   Laryngopharyngeal reflux (LPR) 02/20/2024   Fatigue 02/20/2024   Amenorrhea 02/20/2024   Current use of proton  pump inhibitor 02/20/2024   Essential hypertension 02/20/2024   Gluten intolerance 02/20/2024   Allergic rhinitis 02/20/2024   Anxiety, generalized 01/24/2018   Asthma, severe persistent 01/24/2018   History of colonoscopy 11/26/2013   IBS (irritable bowel syndrome) 11/26/2013   Seborrheic keratosis 05/07/2012   Depressive disorder 02/21/2011   Hypercholesterolemia 02/21/2011   Morbid obesity (HCC) 02/21/2011   Persistent insomnia 02/21/2011   Past Medical History:  Diagnosis Date   Allergy    Anxiety    Asthma    Depression    20 years ago   GERD (gastroesophageal reflux disease)    Hyperlipidemia    Hypertension    Ulcer    Past Surgical History:  Procedure Laterality Date   CARPAL TUNNEL RELEASE Bilateral    TUBAL LIGATION     Social History   Tobacco Use   Smoking status: Never    Passive exposure: Never   Smokeless tobacco: Never  Vaping Use   Vaping status: Never Used  Substance Use Topics   Alcohol use: Never   Drug use: Never   Family  History  Problem Relation Age of Onset   Allergic rhinitis Mother    Asthma Mother    Cancer Father    Allergies  Allergen Reactions   Azithromycin Hives   Clarithromycin Hives   Macrolides And Ketolides Hives   Medroxyprogesterone Other (See Comments)    Mono like sx, fatigue, nausea, vomiting Mono like sx, fatigue, nausea, vomiting Mono like sx, fatigue, nausea, vomiting Mono like sx, fatigue, nausea, vomiting    Rizatriptan Palpitations   Current Outpatient Medications on File Prior to Visit  Medication Sig Dispense Refill   Albuterol -Budesonide (AIRSUPRA ) 90-80 MCG/ACT AERO Inhale 2 Inhalations into the lungs every 4 (four) hours as needed. 11 g 1   budesonide-glycopyrrolate-formoterol (BREZTRI  AEROSPHERE) 160-9-4.8 MCG/ACT AERO inhaler Inhale 2 puffs into the lungs in the morning and at bedtime. 32.1 g 1   EPINEPHrine  (AUVI-Q ) 0.3 mg/0.3 mL IJ SOAJ injection Inject 0.3 mg into the muscle as needed for anaphylaxis. 1 each 1   fexofenadine  (ALLEGRA  ALLERGY) 180 MG tablet Take 1 tablet (180 mg total) by mouth daily. 30 tablet 5   fluticasone  (FLONASE ) 50 MCG/ACT nasal spray Place 2 sprays into both nostrils daily. 1-2 sprays in each nostril during upper airway symptoms 48 g 1   meloxicam  (MOBIC ) 15 MG tablet Take 1 tablet (15 mg total) by mouth daily. 30 tablet 3   Multiple Vitamin (MULTIVITAMIN) capsule Take 1 capsule by mouth daily.     nystatin  (MYCOSTATIN ) 100000 UNIT/ML suspension Take 5 mLs (500,000 Units total) by mouth 4 (four) times daily. 473 mL 1   omeprazole  (PRILOSEC) 40 MG capsule Take 1 capsule (40 mg total) by mouth 2 (two) times daily. 180 capsule 1   sertraline (ZOLOFT) 100 MG tablet Take 150 mg by mouth daily.     Spacer/Aero-Holding [employer] 1 Device by Does not apply route as directed. 1 each 1   Tezepelumab -ekko (TEZSPIRE ) 210 MG/1. SOAJ Inject 210 mg into the skin every 28 (twenty-eight) days. 1.91 mL 11   estradiol (ESTRACE) 0.5 MG tablet Take 0.5  mg by mouth daily. (Patient not taking: Reported on 02/20/2024)     progesterone (PROMETRIUM) 100 MG capsule Take 100 mg by mouth daily. (Patient not taking: Reported on 02/20/2024)     Current Facility-Administered Medications on File Prior to Visit  Medication Dose Route Frequency Provider Last Rate Last Admin   tezepelumab -ekko (TEZSPIRE ) 210 MG/1. syringe 210  mg  210 mg Subcutaneous Q28 days Kozlow, Eric J, MD   210 mg at 01/16/24 1704    Review of Systems  Constitutional:  Positive for fatigue and unexpected weight change. Negative for activity change, appetite change and fever.  HENT:  Negative for congestion, ear pain, rhinorrhea, sinus pressure and sore throat.   Eyes:  Negative for pain, redness and visual disturbance.  Respiratory:  Negative for cough, shortness of breath and wheezing.   Cardiovascular:  Negative for chest pain and palpitations.  Gastrointestinal:  Negative for abdominal pain, blood in stool, constipation and diarrhea.  Endocrine: Positive for heat intolerance. Negative for polydipsia and polyuria.  Genitourinary:  Negative for dysuria, frequency and urgency.  Musculoskeletal:  Negative for arthralgias, back pain and myalgias.  Skin:  Negative for pallor and rash.  Allergic/Immunologic: Negative for environmental allergies.  Neurological:  Negative for dizziness, syncope and headaches.  Hematological:  Negative for adenopathy. Does not bruise/bleed easily.  Psychiatric/Behavioral:  Positive for sleep disturbance. Negative for decreased concentration and dysphoric mood. The patient is not nervous/anxious.        Objective:   Physical Exam Constitutional:      General: She is not in acute distress.    Appearance: Normal appearance. She is well-developed. She is not ill-appearing or diaphoretic.  HENT:     Head: Normocephalic and atraumatic.     Mouth/Throat:     Mouth: Mucous membranes are moist.  Eyes:     Conjunctiva/sclera: Conjunctivae normal.      Pupils: Pupils are equal, round, and reactive to light.  Neck:     Thyroid: No thyromegaly.     Vascular: No carotid bruit or JVD.  Cardiovascular:     Rate and Rhythm: Normal rate and regular rhythm.     Heart sounds: Normal heart sounds.     No gallop.  Pulmonary:     Effort: Pulmonary effort is normal. No respiratory distress.     Breath sounds: Normal breath sounds. No stridor. No wheezing, rhonchi or rales.  Abdominal:     General: There is no distension or abdominal bruit.     Palpations: Abdomen is soft.  Musculoskeletal:     Cervical back: Normal range of motion and neck supple.     Right lower leg: No edema.     Left lower leg: No edema.  Lymphadenopathy:     Cervical: No cervical adenopathy.  Skin:    General: Skin is warm and dry.     Coloration: Skin is not pale.     Findings: No rash.  Neurological:     Mental Status: She is alert.     Coordination: Coordination normal.     Deep Tendon Reflexes: Reflexes are normal and symmetric. Reflexes normal.  Psychiatric:        Mood and Affect: Mood normal.           Assessment & Plan:   Problem List Items Addressed This Visit       Cardiovascular and Mediastinum   Essential hypertension - Primary   New dx BP: (!) 166/98   Labs today  Will choose HTN medication and follow up   Disc lifstyle change with low sodium diet and exercise  DASH eating plan reviewed Encouraged weight loss effort -will discuss further at f/u       Relevant Orders   TSH   Lipid panel   Comprehensive metabolic panel with GFR   CBC with Differential/Platelet     Respiratory  Laryngopharyngeal reflux (LPR)   Takes omeprazole  40 mg daily  From allergist Encouraged to avoid triggering foods       Asthma, severe persistent   Sees Dr Tiana Reviewed last note  Reassuring exam today  Currently controlled with  Breztri  Airsupra  (albuterol  budesonide)  Tezpire injection -has made a big difference       Allergic rhinitis    Sees Dr Tiana  Flonase  Allegra   Taking immunotherapy  Is helpful  Has epi pen for that        Other   Morbid obesity (HCC)   Discussed how this problem influences overall health and the risks it imposes  Reviewed plan for weight loss with lower calorie diet (via better food choices (lower glycemic and portion control) along with exercise building up to or more than 30 minutes 5 days per week including some aerobic activity and strength training    Will discuss further at HTN follow up and review labs       Hypercholesterolemia   Lipid panel today   Encouraged low trans/sat fat diet       Relevant Orders   Lipid panel   Comprehensive metabolic panel with GFR   Gluten intolerance   Pt avoids gluten/feels better Not allergic       Fatigue   Likely multi factorial   Labs today  Then plan follow up       Relevant Orders   TSH   Comprehensive metabolic panel with GFR   CBC with Differential/Platelet   Vitamin B12   VITAMIN D  25 Hydroxy (Vit-D Deficiency, Fractures)   Depressive disorder   In past Pt notes this is now better       Current use of proton pump inhibitor   Omeprazole  40 mg daily for LPR   In light of fatigue D and B12 levels today      Relevant Orders   Vitamin B12   VITAMIN D  25 Hydroxy (Vit-D Deficiency, Fractures)   Anxiety, generalized   Chronic  History of depression/that is better  Well controlled with sertraline 150 mg daily   PHQ 3   (due to fatigue)  GAD7   1         Amenorrhea   Since 2020  Suspect menopausal  In light of obesity want to make sure not from endomet hyperplasia   FSH and LH today      Relevant Orders   Follicle Stimulating Hormone   Luteinizing hormone

## 2024-02-20 NOTE — Assessment & Plan Note (Signed)
 Lipid panel today   Encouraged low trans/sat fat diet

## 2024-02-20 NOTE — Assessment & Plan Note (Signed)
 Pt avoids gluten/feels better Not allergic

## 2024-02-20 NOTE — Assessment & Plan Note (Signed)
 Discussed how this problem influences overall health and the risks it imposes  Reviewed plan for weight loss with lower calorie diet (via better food choices (lower glycemic and portion control) along with exercise building up to or more than 30 minutes 5 days per week including some aerobic activity and strength training    Will discuss further at HTN follow up and review labs

## 2024-02-20 NOTE — Assessment & Plan Note (Signed)
 Takes omeprazole  40 mg daily  From allergist Encouraged to avoid triggering foods

## 2024-02-20 NOTE — Assessment & Plan Note (Signed)
 Likely multi factorial   Labs today  Then plan follow up

## 2024-02-20 NOTE — Assessment & Plan Note (Signed)
 In past Pt notes this is now better

## 2024-02-20 NOTE — Assessment & Plan Note (Signed)
 Chronic  History of depression/that is better  Well controlled with sertraline 150 mg daily   PHQ 3   (due to fatigue)  GAD7   1

## 2024-02-20 NOTE — Patient Instructions (Signed)
 Labs today for fatigue/ hormone assessment / cholesterol/ blood pressure   We will choose a blood pressure medication and plan follow up when labs come back   Take a look at the DASH eating plan Try to get most of your carbohydrates from produce (with the exception of white potatoes) and whole grains Eat less bread/pasta/rice/snack foods/cereals/sweets and other items from the middle of the grocery store (processed carbs)  For cholesterol  Avoid red meat/ fried foods/ egg yolks/ fatty breakfast meats/ butter, cheese and high fat dairy/ and shellfish

## 2024-02-20 NOTE — Progress Notes (Signed)
 Specialty Pharmacy Refill Coordination Note  Shelia Bowers is a 53 y.o. female contacted today regarding refills of specialty medication(s) Tezepelumab -ekko (Tezspire )   Patient requested (Patient-Rptd) Delivery   Delivery date: 03/06/24   Verified address: (Patient-Rptd) [address] [place], [place] [address]   Medication will be filled on 03/05/24.

## 2024-02-20 NOTE — Assessment & Plan Note (Signed)
 Omeprazole  40 mg daily for LPR   In light of fatigue D and B12 levels today

## 2024-02-20 NOTE — Assessment & Plan Note (Signed)
 Since 2020  Suspect menopausal  In light of obesity want to make sure not from endomet hyperplasia   FSH and LH today

## 2024-02-20 NOTE — Assessment & Plan Note (Signed)
 Sees Dr Tiana Reviewed last note  Reassuring exam today  Currently controlled with  Breztri  Airsupra  (albuterol  budesonide)  Tezpire injection -has made a big difference

## 2024-02-20 NOTE — Assessment & Plan Note (Signed)
 New dx BP: (!) 166/98   Labs today  Will choose HTN medication and follow up   Disc lifstyle change with low sodium diet and exercise  DASH eating plan reviewed Encouraged weight loss effort -will discuss further at f/u

## 2024-02-20 NOTE — Assessment & Plan Note (Signed)
 Sees Dr Tiana  Flonase  Allegra   Taking immunotherapy  Is helpful  Has epi pen for that

## 2024-02-21 ENCOUNTER — Encounter: Payer: Self-pay | Admitting: Family Medicine

## 2024-02-21 DIAGNOSIS — J3089 Other allergic rhinitis: Secondary | ICD-10-CM | POA: Diagnosis not present

## 2024-02-21 DIAGNOSIS — J3081 Allergic rhinitis due to animal (cat) (dog) hair and dander: Secondary | ICD-10-CM | POA: Diagnosis not present

## 2024-02-21 DIAGNOSIS — J301 Allergic rhinitis due to pollen: Secondary | ICD-10-CM | POA: Diagnosis not present

## 2024-02-23 ENCOUNTER — Encounter: Payer: Self-pay | Admitting: Family Medicine

## 2024-02-27 ENCOUNTER — Ambulatory Visit
Admission: RE | Admit: 2024-02-27 | Discharge: 2024-02-27 | Disposition: A | Discharge status: Home / Self Care | Source: Ambulatory Visit | Attending: Family Medicine | Admitting: Family Medicine

## 2024-02-27 DIAGNOSIS — N912 Amenorrhea, unspecified: Secondary | ICD-10-CM

## 2024-03-01 ENCOUNTER — Ambulatory Visit: Payer: Self-pay | Admitting: Family Medicine

## 2024-03-01 ENCOUNTER — Ambulatory Visit (INDEPENDENT_AMBULATORY_CARE_PROVIDER_SITE_OTHER): Admitting: Family Medicine

## 2024-03-01 ENCOUNTER — Other Ambulatory Visit: Payer: Self-pay | Admitting: Medical Genetics

## 2024-03-01 ENCOUNTER — Encounter: Payer: Self-pay | Admitting: Family Medicine

## 2024-03-01 VITALS — BP 122/81 | HR 88 | Temp 98.4°F | Ht 65.5 in | Wt 308.5 lb

## 2024-03-01 DIAGNOSIS — E7800 Pure hypercholesterolemia, unspecified: Secondary | ICD-10-CM | POA: Diagnosis not present

## 2024-03-01 DIAGNOSIS — Z23 Encounter for immunization: Secondary | ICD-10-CM | POA: Diagnosis not present

## 2024-03-01 DIAGNOSIS — Z79899 Other long term (current) drug therapy: Secondary | ICD-10-CM

## 2024-03-01 DIAGNOSIS — I10 Essential (primary) hypertension: Secondary | ICD-10-CM

## 2024-03-01 DIAGNOSIS — M722 Plantar fascial fibromatosis: Secondary | ICD-10-CM | POA: Insufficient documentation

## 2024-03-01 LAB — BASIC METABOLIC PANEL WITH GFR
BUN: 13 mg/dL (ref 6–23)
CO2: 29 meq/L (ref 19–32)
Calcium: 8.8 mg/dL (ref 8.4–10.5)
Chloride: 104 meq/L (ref 96–112)
Creatinine, Ser: 0.81 mg/dL (ref 0.40–1.20)
GFR: 82.54 mL/min (ref >60.00)
Glucose, Bld: 112 mg/dL — ABNORMAL HIGH (ref 70–99)
Potassium: 3.5 meq/L (ref 3.5–5.1)
Sodium: 143 meq/L (ref 135–145)

## 2024-03-01 NOTE — Patient Instructions (Addendum)
 For cholesterol Avoid red meat/ fried foods/ egg yolks/ fatty breakfast meats/ butter, cheese and high fat dairy/ and shellfish   Let's re check cholesterol with optimal diet in about 2 months   Continue losartan  hct  Labs today   I put the referral in for the healthy weight and wellness clinic in [place]  Please let us  know if you don't hear in 1-2 weeks to set that up (mychart message or call or letter)

## 2024-03-01 NOTE — Progress Notes (Signed)
 Subjective:    Patient ID: Shelia Bowers, female    DOB: [birth year], 53 y.o.   MRN: [id number]  HPI  Wt Readings from Last 3 Encounters:  03/01/24 (!) 308 lb 8 oz (139.9 kg)  02/20/24 (!) 312 lb 2 oz (141.6 kg)  02/15/24 (!) 311 lb (141.1 kg)   50.56 kg/m  Vitals:   03/01/24 0801 03/01/24 0821  BP: (!) 142/92 122/81  Pulse: 88   Temp: 98.4 F (36.9 C)   SpO2: 93%     Pt presents for follow up of  HTN Hyperlipidemia Obesity   Has been trying to follow DASH eating plan  Less processed foods  More veggies  Lean protein   Drinking more water   Bought a Land   Feels better also   HTN bp is stable today  No cp or palpitations or headaches or edema  No side effects to medicines  BP Readings from Last 3 Encounters:  03/01/24 122/81  02/20/24 (!) 166/98  02/15/24 (!) 162/102    Losartan  hct 50-12.5 mg daily  Lab Results  Component Value Date   NA 143 03/01/2024   K 3.5 03/01/2024   CO2 29 03/01/2024   GLUCOSE 112 (H) 03/01/2024   BUN 13 03/01/2024   CREATININE 0.81 03/01/2024   CALCIUM 8.8 03/01/2024   GFR 82.54 03/01/2024   GFRNONAA >60 10/20/2021   Lab Results  Component Value Date   ALT 40 (H) 02/20/2024   AST 28 02/20/2024   ALKPHOS 61 02/20/2024   BILITOT 0.4 02/20/2024   No tylenol   No alcohol    Lab Results  Component Value Date   WBC 8.4 02/20/2024   HGB 13.8 02/20/2024   HCT 41.5 02/20/2024   MCV 92.2 02/20/2024   PLT 246.0 02/20/2024   Lab Results  Component Value Date   TSH 1.90 02/20/2024     Cholesterol Lab Results  Component Value Date   CHOL 284 (H) 02/20/2024   HDL 50.20 02/20/2024   LDLCALC 190 (H) 02/20/2024   TRIG 216.0 (H) 02/20/2024   CHOLHDL 6 02/20/2024   No shellfish Not much beef  Some fried foods  No fatty breakfast meats   Father had high cholesterol    Takes ppi Lab Results  Component Value Date   VITAMINB12 477 02/20/2024   Last vitamin D  Lab Results  Component Value  Date   VD25OH 39.51 02/20/2024    Did FSH and LH for amenorrhea  Did not 100% confirm menopause  Pelvic us  was ordered   Weight has been a problem forever  Has tried many strategies and programs  Plantar fasciitis in left foot -limites exercise (had surgery)   Did weight watchers -did not work  Lost [employer] with massive dental work in past-gained it past   Took saxanda for a while then covid happened       Patient Active Problem List   Diagnosis Date Noted   Plantar fasciitis, left 03/01/2024   Laryngopharyngeal reflux (LPR) 02/20/2024   Fatigue 02/20/2024   Amenorrhea 02/20/2024   Current use of proton pump inhibitor 02/20/2024   Essential hypertension 02/20/2024   Gluten intolerance 02/20/2024   Allergic rhinitis 02/20/2024   Anxiety, generalized 01/24/2018   Asthma, severe persistent 01/24/2018   History of colonoscopy 11/26/2013   IBS (irritable bowel syndrome) 11/26/2013   Seborrheic keratosis 05/07/2012   Depressive disorder 02/21/2011   Hypercholesterolemia 02/21/2011   Morbid obesity (HCC) 02/21/2011   Persistent insomnia 02/21/2011   Past  Medical History:  Diagnosis Date   Allergy    Anxiety    Asthma    Depression    20 years ago   GERD (gastroesophageal reflux disease)    Hyperlipidemia    Hypertension    Ulcer    Past Surgical History:  Procedure Laterality Date   CARPAL TUNNEL RELEASE Bilateral    TUBAL LIGATION     Social History   Tobacco Use   Smoking status: Never    Passive exposure: Never   Smokeless tobacco: Never  Vaping Use   Vaping status: Never Used  Substance Use Topics   Alcohol use: Never   Drug use: Never   Family History  Problem Relation Age of Onset   Allergic rhinitis Mother    Asthma Mother    Cancer Father    Allergies  Allergen Reactions   Azithromycin Hives   Clarithromycin Hives   Macrolides And Ketolides Hives   Medroxyprogesterone Other (See Comments)    Mono like sx, fatigue, nausea,  vomiting Mono like sx, fatigue, nausea, vomiting Mono like sx, fatigue, nausea, vomiting Mono like sx, fatigue, nausea, vomiting    Rizatriptan Palpitations   Current Outpatient Medications on File Prior to Visit  Medication Sig Dispense Refill   Albuterol -Budesonide (AIRSUPRA ) 90-80 MCG/ACT AERO Inhale 2 Inhalations into the lungs every 4 (four) hours as needed. 11 g 1   budesonide-glycopyrrolate-formoterol (BREZTRI  AEROSPHERE) 160-9-4.8 MCG/ACT AERO inhaler Inhale 2 puffs into the lungs in the morning and at bedtime. 32.1 g 1   EPINEPHrine  (AUVI-Q ) 0.3 mg/0.3 mL IJ SOAJ injection Inject 0.3 mg into the muscle as needed for anaphylaxis. 1 each 1   estradiol (ESTRACE) 0.5 MG tablet Take 0.5 mg by mouth daily.     fexofenadine  (ALLEGRA  ALLERGY) 180 MG tablet Take 1 tablet (180 mg total) by mouth daily. 30 tablet 5   fluticasone  (FLONASE ) 50 MCG/ACT nasal spray Place 2 sprays into both nostrils daily. 1-2 sprays in each nostril during upper airway symptoms 48 g 1   losartan -hydrochlorothiazide (HYZAAR) 50-12.5 MG tablet Take 1 tablet by mouth daily. 30 tablet 1   meloxicam  (MOBIC ) 15 MG tablet Take 1 tablet (15 mg total) by mouth daily. 30 tablet 3   Multiple Vitamin (MULTIVITAMIN) capsule Take 1 capsule by mouth daily.     nystatin  (MYCOSTATIN ) 100000 UNIT/ML suspension Take 5 mLs (500,000 Units total) by mouth 4 (four) times daily. 473 mL 1   omeprazole  (PRILOSEC) 40 MG capsule Take 1 capsule (40 mg total) by mouth 2 (two) times daily. 180 capsule 1   progesterone (PROMETRIUM) 100 MG capsule Take 100 mg by mouth daily.     sertraline (ZOLOFT) 100 MG tablet Take 150 mg by mouth daily.     Spacer/Aero-Holding Chambers DEVI 1 Device by Does not apply route as directed. 1 each 1   Tezepelumab -ekko (TEZSPIRE ) 210 MG/1. SOAJ Inject 210 mg into the skin every 28 (twenty-eight) days. 1.91 mL 11   Current Facility-Administered Medications on File Prior to Visit  Medication Dose Route Frequency  Provider Last Rate Last Admin   tezepelumab -ekko (TEZSPIRE ) 210 MG/1. syringe 210 mg  210 mg Subcutaneous Q28 days Kozlow, Eric J, MD   210 mg at 01/16/24 1704    Review of Systems  Constitutional:  Negative for activity change, appetite change, fatigue, fever and unexpected weight change.  HENT:  Negative for congestion, rhinorrhea, sore throat and trouble swallowing.   Eyes:  Negative for pain, redness, itching and visual disturbance.  Respiratory:  Negative for cough, chest tightness, shortness of breath and wheezing.   Cardiovascular:  Negative for chest pain and palpitations.  Gastrointestinal:  Negative for abdominal pain, blood in stool, constipation, diarrhea and nausea.  Endocrine: Negative for cold intolerance, heat intolerance, polydipsia and polyuria.  Genitourinary:  Negative for difficulty urinating, dysuria, frequency and urgency.  Musculoskeletal:  Positive for arthralgias. Negative for joint swelling and myalgias.       Left foot pain with plantar fasciitis   Skin:  Negative for pallor and rash.  Neurological:  Negative for dizziness, tremors, weakness, numbness and headaches.  Hematological:  Negative for adenopathy. Does not bruise/bleed easily.  Psychiatric/Behavioral:  Negative for decreased concentration and dysphoric mood. The patient is not nervous/anxious.        Objective:   Physical Exam Constitutional:      General: She is not in acute distress.    Appearance: Normal appearance. She is well-developed. She is obese. She is not ill-appearing or diaphoretic.  HENT:     Head: Normocephalic and atraumatic.  Eyes:     Conjunctiva/sclera: Conjunctivae normal.     Pupils: Pupils are equal, round, and reactive to light.  Neck:     Thyroid: No thyromegaly.     Vascular: No carotid bruit or JVD.  Cardiovascular:     Rate and Rhythm: Normal rate and regular rhythm.     Heart sounds: Normal heart sounds.     No gallop.  Pulmonary:     Effort: Pulmonary  effort is normal. No respiratory distress.     Breath sounds: Normal breath sounds. No wheezing or rales.  Abdominal:     General: There is no distension or abdominal bruit.     Palpations: Abdomen is soft.  Musculoskeletal:     Cervical back: Normal range of motion and neck supple.     Right lower leg: No edema.     Left lower leg: No edema.  Lymphadenopathy:     Cervical: No cervical adenopathy.  Skin:    General: Skin is warm and dry.     Coloration: Skin is not pale.     Findings: No rash.  Neurological:     Mental Status: She is alert.     Coordination: Coordination normal.     Deep Tendon Reflexes: Reflexes are normal and symmetric. Reflexes normal.  Psychiatric:        Mood and Affect: Mood normal.           Assessment & Plan:   Problem List Items Addressed This Visit       Cardiovascular and Mediastinum   Essential hypertension - Primary   bp in fair control at this time  BP Readings from Last 1 Encounters:  03/01/24 122/81   No changes needed Most recent labs reviewed  Disc lifstyle change with low sodium diet and exercise  Improved with losartan  hct 50-12.5 mg daily   Bmet today Working on weight loss       Relevant Orders   Basic metabolic panel with GFR (Completed)   Amb Ref to Medical Weight Management     Other   Morbid obesity (HCC)   Discussed how this problem influences overall health and the risks it imposes  Reviewed plan for weight loss with lower calorie diet (via better food choices (lower glycemic and portion control) along with exercise building up to or more than 30 minutes 5 days per week including some aerobic activity and strength training   Pt has tried several programs  in past Interested in the healthy weight and wellness clinic I think she would do well with this  Referral done        Relevant Orders   Amb Ref to Medical Weight Management   Hypercholesterolemia   Disc goals for lipids and reasons to control them Rev  last labs with pt Rev low sat fat diet in detail LDL is 190  Quite high   Pt plans to work on diet/exercise Re check 2 mo  Will most likely need statin in future      Relevant Orders   Amb Ref to Medical Weight Management   Lipid panel   Current use of proton pump inhibitor   Lab Results  Component Value Date   VITAMINB12 477 02/20/2024   Last vitamin D  Lab Results  Component Value Date   VD25OH 39.51 02/20/2024         Other Visit Diagnoses       Need for influenza vaccination       Relevant Orders   Flu vaccine trivalent PF, 6mos and older(Flulaval,Afluria,Fluarix,Fluzone) (Completed)

## 2024-03-01 NOTE — Assessment & Plan Note (Signed)
 Lab Results  Component Value Date   VITAMINB12 477 02/20/2024   Last vitamin D  Lab Results  Component Value Date   VD25OH 39.51 02/20/2024

## 2024-03-01 NOTE — Assessment & Plan Note (Signed)
 bp in fair control at this time  BP Readings from Last 1 Encounters:  03/01/24 122/81   No changes needed Most recent labs reviewed  Disc lifstyle change with low sodium diet and exercise  Improved with losartan  hct 50-12.5 mg daily   Bmet today Working on weight loss

## 2024-03-01 NOTE — Assessment & Plan Note (Signed)
 Discussed how this problem influences overall health and the risks it imposes  Reviewed plan for weight loss with lower calorie diet (via better food choices (lower glycemic and portion control) along with exercise building up to or more than 30 minutes 5 days per week including some aerobic activity and strength training   Pt has tried several programs in past Interested in the healthy weight and wellness clinic I think she would do well with this  Referral done

## 2024-03-01 NOTE — Assessment & Plan Note (Signed)
 Disc goals for lipids and reasons to control them Rev last labs with pt Rev low sat fat diet in detail LDL is 190  Quite high   Pt plans to work on diet/exercise Re check 2 mo  Will most likely need statin in future

## 2024-03-05 ENCOUNTER — Ambulatory Visit
Admission: RE | Admit: 2024-03-05 | Discharge: 2024-03-05 | Disposition: A | Discharge status: Home / Self Care | Source: Ambulatory Visit | Attending: Family Medicine | Admitting: Family Medicine

## 2024-03-05 DIAGNOSIS — N912 Amenorrhea, unspecified: Secondary | ICD-10-CM | POA: Diagnosis not present

## 2024-03-07 ENCOUNTER — Ambulatory Visit (INDEPENDENT_AMBULATORY_CARE_PROVIDER_SITE_OTHER)

## 2024-03-07 DIAGNOSIS — J309 Allergic rhinitis, unspecified: Secondary | ICD-10-CM

## 2024-03-11 ENCOUNTER — Ambulatory Visit (INDEPENDENT_AMBULATORY_CARE_PROVIDER_SITE_OTHER): Admitting: Podiatry

## 2024-03-11 DIAGNOSIS — M7662 Achilles tendinitis, left leg: Secondary | ICD-10-CM | POA: Diagnosis not present

## 2024-03-11 DIAGNOSIS — M62462 Contracture of muscle, left lower leg: Secondary | ICD-10-CM | POA: Diagnosis not present

## 2024-03-11 DIAGNOSIS — M722 Plantar fascial fibromatosis: Secondary | ICD-10-CM

## 2024-03-11 DIAGNOSIS — M7671 Peroneal tendinitis, right leg: Secondary | ICD-10-CM

## 2024-03-12 ENCOUNTER — Ambulatory Visit: Payer: Self-pay | Admitting: Family Medicine

## 2024-03-12 DIAGNOSIS — N912 Amenorrhea, unspecified: Secondary | ICD-10-CM

## 2024-03-12 DIAGNOSIS — R9389 Abnormal findings on diagnostic imaging of other specified body structures: Secondary | ICD-10-CM | POA: Insufficient documentation

## 2024-03-14 ENCOUNTER — Encounter: Payer: Self-pay | Admitting: Podiatry

## 2024-03-14 ENCOUNTER — Other Ambulatory Visit: Payer: Self-pay | Admitting: Family Medicine

## 2024-03-14 NOTE — Patient Instructions (Signed)
 The brace we discussed is called a Bauerfeind Malleotrain brace

## 2024-03-14 NOTE — Addendum Note (Signed)
 Addended byBETHA MEDICINE, Yerachmiel Spinney R on: 03/14/2024 08:31 AM   Modules accepted: Orders, Level of Service

## 2024-03-14 NOTE — Progress Notes (Addendum)
  Subjective:  Patient ID: Shelia Bowers, female    DOB: January 09, 1971,  MRN: [id number]  Chief Complaint  Patient presents with   Routine Post Op    Left ankle weakness / stiffness post procedure.The pain increases as she is up and mobile.    53 y.o. female presents with the above complaint. History confirmed with patient.   Objective:  Physical Exam: warm, good capillary refill, no trophic changes or ulcerative lesions, normal DP and PT pulses, normal sensory exam, and no instability or calf pain to indicate rupture, some tenderness in the most inferior portion of the posterior and plantar heel.  More pain in the peroneals   Assessment:   1. Achilles tendinitis, left leg   2. Plantar fasciitis   3. Gastrocnemius equinus of left lower extremity   4. Peroneal tendinitis, right leg      Plan:  Patient was evaluated and treated and all questions answered.  Has had a slight minor setback, continue home physical therapy exercises and supportive shoes.  Recommended a Bauerfeind MalleoTrain brace.  OTC NSAIDs as needed and ice as needed.  Follow-up in 2 months to reevaluate.  Referral placed to [facility]  No follow-ups on file.

## 2024-03-15 ENCOUNTER — Encounter: Payer: Self-pay | Admitting: Allergy and Immunology

## 2024-03-23 ENCOUNTER — Other Ambulatory Visit
Admission: RE | Admit: 2024-03-23 | Discharge: 2024-03-23 | Disposition: A | Discharge status: Home / Self Care | Payer: Self-pay | Source: Ambulatory Visit | Attending: Medical Genetics | Admitting: Medical Genetics

## 2024-03-24 DIAGNOSIS — Z0289 Encounter for other administrative examinations: Secondary | ICD-10-CM

## 2024-03-25 ENCOUNTER — Ambulatory Visit (INDEPENDENT_AMBULATORY_CARE_PROVIDER_SITE_OTHER): Admitting: Family Medicine

## 2024-03-25 ENCOUNTER — Encounter (INDEPENDENT_AMBULATORY_CARE_PROVIDER_SITE_OTHER): Payer: Self-pay | Admitting: Family Medicine

## 2024-03-25 VITALS — BP 138/82 | HR 96 | Temp 97.8°F | Ht 65.5 in | Wt 306.0 lb

## 2024-03-25 DIAGNOSIS — F411 Generalized anxiety disorder: Secondary | ICD-10-CM | POA: Diagnosis not present

## 2024-03-25 DIAGNOSIS — J45909 Unspecified asthma, uncomplicated: Secondary | ICD-10-CM

## 2024-03-25 DIAGNOSIS — I10 Essential (primary) hypertension: Secondary | ICD-10-CM | POA: Diagnosis not present

## 2024-03-25 DIAGNOSIS — Z6841 Body Mass Index (BMI) 40.0 and over, adult: Secondary | ICD-10-CM

## 2024-03-25 NOTE — Progress Notes (Signed)
 Shelia DOROTHA Jenkins, DO, ABFM, ABOM Bariatric physician [address] [place], [place], [place] [address] Office: [phone]  /  Fax: [phone]     Initial Evaluation:  Shelia Bowers was seen in clinic today to evaluate for obesity. She is interested in losing weight to improve overall health and reduce the risk of weight related complications. She presents today to review program treatment options, initial physical assessment, and evaluation.      She was referred by: PCP  When asked what they hope to accomplish? She states: She wants to improve existing medical conditions and have accountability.   When asked how has your weight affected you? She states: Has affected self-esteem, Contributed to medical problems, Contributed to orthopedic problems or mobility issues, Having fatigue, and Having poor endurance  Contributing factors to her weight change: family history of obesity, reduced physical activity, and stress and problems with eating patterns.  Some associated conditions: Hypertension, Hyperlipidemia, GERD, Lung disease, and Other: Fatty liver and GAD  Current nutrition plan: Other: No gluten and low fat diet  Current level of physical activity: Walking 30 minutes, seven days a week  Current or previous pharmacotherapy: Metformin and Other: Saxenda and Contrave  Response to medication: Had side effects so it was discontinued. Previously on Contrave but discontinued due to side effect of fatigue. Previously on Saxenda from 2019-2020 (9 months) but discontinued after losing 80 lbs, which she regained over time.     Past Medical History:  Diagnosis Date   Allergy    Anxiety    Asthma    Depression    20 years ago   GERD (gastroesophageal reflux disease)    Hyperlipidemia    Hypertension    Ulcer     Current Outpatient Medications  Medication Instructions   Albuterol -Budesonide (AIRSUPRA ) 90-80 MCG/ACT AERO 2 Inhalations, Inhalation, Every 4 hours PRN    budesonide-glycopyrrolate-formoterol (BREZTRI  AEROSPHERE) 160-9-4.8 MCG/ACT AERO inhaler 2 puffs, Inhalation, 2 times daily   EPINEPHrine  (AUVI-Q ) 0.3 mg, Intramuscular, As needed   estradiol (ESTRACE) 0.5 mg, Daily   fexofenadine  (ALLEGRA  ALLERGY) 180 mg, Oral, Daily   fluticasone  (FLONASE ) 50 MCG/ACT nasal spray 2 sprays, Each Nare, Daily, 1-2 sprays in each nostril during upper airway symptoms   losartan -hydrochlorothiazide (HYZAAR) 50-12.5 MG tablet 1 tablet, Oral, Daily   meloxicam  (MOBIC ) 15 mg, Oral, Daily   Multiple Vitamin (MULTIVITAMIN) capsule 1 capsule, Daily   nystatin  (MYCOSTATIN ) 500,000 Units, Oral, 4 times daily   omeprazole  (PRILOSEC) 40 mg, Oral, 2 times daily   progesterone (PROMETRIUM) 100 mg, Daily   sertraline (ZOLOFT) 150 mg, Daily   Spacer/Aero-Holding Chambers DEVI 1 Device, Does not apply, As directed   Tezspire  210 mg, Subcutaneous, Every 28 days     Allergies  Allergen Reactions   Azithromycin Hives   Clarithromycin Hives   Macrolides And Ketolides Hives   Medroxyprogesterone Other (See Comments)    Mono like sx, fatigue, nausea, vomiting Mono like sx, fatigue, nausea, vomiting Mono like sx, fatigue, nausea, vomiting Mono like sx, fatigue, nausea, vomiting    Rizatriptan Palpitations     Past Surgical History:  Procedure Laterality Date   CARPAL TUNNEL RELEASE Bilateral    TUBAL LIGATION       Family History  Problem Relation Age of Onset   Allergic rhinitis Mother    Asthma Mother    Cancer Father      Objective:  BP 138/82   Pulse 96   Temp 97.8 F (36.6 C)   Ht 5' 5.5 (  1.664 m)   Wt (!) 306 lb (138.8 kg)   SpO2 96%   BMI 50.15 kg/m  She was weighed on the bioimpedance scale: Body mass index is 50.15 kg/m.  Visceral Fat rating : 20, Body Fat %:54  Weight Lost Since Last Visit: 0lb  Weight Gained Since Last Visit: 0lb    Vitals Temp: 97.8 F (36.6 C) BP: 138/82 Pulse Rate: 96 SpO2: 96 %   Anthropometric  Measurements Height: 5' 5.5 (1.664 m) Weight: (!) 306 lb (138.8 kg) BMI (Calculated): 50.13 Weight at Last Visit: NA Weight Lost Since Last Visit: 0lb Weight Gained Since Last Visit: 0lb Starting Weight: NA Total Weight Loss (lbs): 0 lb (0 kg) Peak Weight: 306lb Waist Measurement : -- (NA)   Body Composition  Body Fat %: 54 % Fat Mass (lbs): 165.4 lbs Muscle Mass (lbs): 134 lbs Total Body Water (lbs): 104.2 lbs Visceral Fat Rating : 20   Other Clinical Data Fasting: No Labs: No Today's Visit #: Consult Starting Date: -- (NA) Comments: Consult     General: Well Developed, well nourished, and in no acute distress.  HEENT: Normocephalic, atraumatic; EOMI, sclerae are anicteric. Skin: Warm and dry, good turgor Chest:  Normal excursion, shape, no gross ABN Respiratory: No conversational dyspnea; speaking in full sentences NeuroM-Sk:  Normal gross ROM * 4 extremities  Psych: A and O *3, insight adequate, mood- full    Assessment and Plan:   FOR THE DISEASE OF OBESITY:   Morbid obesity (HCC) Assessment & Plan: We reviewed anthropometrics, biometrics, associated medical conditions and contributing factors with patient. Kelsay would benefit from a medically tailored reduced calorie nutrional plan based on their REE (resting energy expenditure), which will be determined by indirect calorimetry.  We will also assess for cardiometabolic risk and nutritional derangements via fasting labs at intake appointment.    Obesity Treatment / Action Plan:   she was weighed on the bioimpedance scale and results were discussed and documented in the synopsis.   Shelia LELON Collet will complete provided nutritional and psychosocial assessment questionnaire before the next appointment.  she will be scheduled for indirect calorimetry to determine resting energy expenditure in a fasting state.  This will allow us  to create a reduced calorie, high-protein meal plan to promote loss of fat  mass while preserving muscle mass.  We will also assess for cardiometabolic risk and nutritional derangements via an ECG and fasting serologies at her next appointment.  she was encouraged to work on amassing support from family and friends to begin their weight loss journey.   Work on eliminating or reducing the presence of highly processed, poorly nutritious, calorie-dense foods in the home.   Obesity Education Performed Today:  Patient was counseled on nutritional approaches to weight loss and benefits of reducing processed foods and consuming plant-based foods and high quality protein as part of nutritional weight management program.   We discussed the importance of long term lifestyle changes which include nutrition, exercise and behavioral modifications as well as the importance of customizing this to her specific health and social needs.   We discussed the benefits of reaching a healthier weight to alleviate the symptoms of existing conditions and reduce the risks of the biomechanical, metabolic and psychological effects of obesity.  Was counseled on the health benefits of losing 5%-10% of total body weight.  Was counseled on our cognitive behavorial therapy program, lead by our bariatric psychologist, who focuses on emotional eating and creating positive behavorial change.  Was counseled  on bariatric pharmacotherapy and how this may be used as an adjunct in their weight management    Hartley appears to be in the action stage of change and states they are ready to start intensive lifestyle modifications and behavioral modifications.  It was recommended that she follow up in the next 1-2 weeks to review the above steps, and to continue with treatment of their chronic disease state of obesity   FOR OTHER CONDITIONS RELATED TO THE DISEASE OF OBESITY:  Essential hypertension Assessment & Plan  Last 3 blood pressure readings in our office are as follows: BP Readings from Last 3  Encounters:  03/25/24 138/82  03/01/24 122/81  02/20/24 (!) 166/98   The 10-year ASCVD risk score (Arnett DK, et al., 2019) is: 4.5%  Lab Results  Component Value Date   CREATININE 0.81 03/01/2024  States she was diagnosed with HTN recently. BP was slightly elevated today of 138/82. Goal BP:<130/80 on a regular basis. Pt asx. Cont Hyzaar 50-12.5 mg daily. She would benefit from a low sodium heart healthy meal plan and wt loss to improve BP control. Pt reminded to f/up with PCP as directed.    Anxiety, generalized Assessment & Plan Has a history of anxiety and was diagnosed 4-5 years ago. Pt reports an increase in stress when her father moved in and then his recent passing last year. Mood is well controlled today. Currently taking Zoloft 150 mg daily. No acute concerns today. Pt would benefit from regular exercise to support anxiety management. Following a prudent nutritional plan can also support emotional well-being.     Moderate asthma, unspecified whether complicated, unspecified whether persistent Assessment & Plan  Has a history of asthma and is well-controlled. Pt is prescribed Albuterol - Budesonide 2 Inhalations every 4 hours as needed, but reports no recent use of her bronchodilator. Her asthma is managed by Maurilio Camellia PARAS, MD, Allergy and Immunology. No acute concerns today. Cont treatment and f/up with Dr. Kozlow as directed. Currently on Tezepelumab  injections every 4 weeks.     Attestations:   LILLETTE Feliciano Mingle, acting as a medical scribe for Shelia Jenkins, DO., have compiled all relevant documentation for today's office visit on behalf of Isobelle Tuckett, DO, while in the presence of [employer], DO.  I have spent 43 minutes in the care of the patient today.  33 minutes was spent in face to face counseling of the patient on the disease of obesity and what our program can do for their medical conditions as well as in preventing future diseases. I discussed the  importance of comprehensive care in the treatment of obesity including mental well being and physical activity.   I have reviewed the above documentation for accuracy and completeness, and I agree with the above. Shelia PARAS Bowers, D.O.  The 21st Century Cures Act was signed into law in 2016 which includes the topic of electronic health records.  This provides immediate access to information in MyChart.  This includes consultation notes, operative notes, office notes, lab results and pathology reports.  If you have any questions about what you read please let us  know at your next visit so we can discuss your concerns and take corrective action if need be.  We are right here with you!

## 2024-03-26 ENCOUNTER — Other Ambulatory Visit: Payer: Self-pay | Admitting: Pharmacy Technician

## 2024-03-26 ENCOUNTER — Encounter (INDEPENDENT_AMBULATORY_CARE_PROVIDER_SITE_OTHER): Payer: Self-pay | Admitting: Family Medicine

## 2024-03-26 ENCOUNTER — Encounter (INDEPENDENT_AMBULATORY_CARE_PROVIDER_SITE_OTHER): Payer: Self-pay

## 2024-03-26 ENCOUNTER — Ambulatory Visit (INDEPENDENT_AMBULATORY_CARE_PROVIDER_SITE_OTHER): Admitting: Family Medicine

## 2024-03-26 ENCOUNTER — Other Ambulatory Visit: Payer: Self-pay

## 2024-03-26 VITALS — BP 125/84 | HR 81 | Temp 97.4°F | Ht 65.5 in | Wt 304.0 lb

## 2024-03-26 DIAGNOSIS — K9041 Non-celiac gluten sensitivity: Secondary | ICD-10-CM | POA: Diagnosis not present

## 2024-03-26 DIAGNOSIS — K259 Gastric ulcer, unspecified as acute or chronic, without hemorrhage or perforation: Secondary | ICD-10-CM

## 2024-03-26 DIAGNOSIS — E669 Obesity, unspecified: Secondary | ICD-10-CM

## 2024-03-26 DIAGNOSIS — I10 Essential (primary) hypertension: Secondary | ICD-10-CM | POA: Diagnosis not present

## 2024-03-26 DIAGNOSIS — E785 Hyperlipidemia, unspecified: Secondary | ICD-10-CM

## 2024-03-26 DIAGNOSIS — R739 Hyperglycemia, unspecified: Secondary | ICD-10-CM

## 2024-03-26 DIAGNOSIS — Z1331 Encounter for screening for depression: Secondary | ICD-10-CM | POA: Diagnosis not present

## 2024-03-26 DIAGNOSIS — J45998 Other asthma: Secondary | ICD-10-CM

## 2024-03-26 DIAGNOSIS — Z6841 Body Mass Index (BMI) 40.0 and over, adult: Secondary | ICD-10-CM

## 2024-03-26 DIAGNOSIS — K200 Eosinophilic esophagitis: Secondary | ICD-10-CM

## 2024-03-26 DIAGNOSIS — R5383 Other fatigue: Secondary | ICD-10-CM

## 2024-03-26 DIAGNOSIS — E782 Mixed hyperlipidemia: Secondary | ICD-10-CM

## 2024-03-26 DIAGNOSIS — R0609 Other forms of dyspnea: Secondary | ICD-10-CM

## 2024-03-26 DIAGNOSIS — J4550 Severe persistent asthma, uncomplicated: Secondary | ICD-10-CM

## 2024-03-26 NOTE — Progress Notes (Signed)
 Specialty Pharmacy Refill Coordination Note  Shelia Bowers is a 53 y.o. female contacted today regarding refills of specialty medication(s) Tezepelumab -ekko (Tezspire )   Patient requested (Patient-Rptd) Delivery   Delivery date: 04/03/24 Verified address: (Patient-Rptd) [address] [place], [place] [address]   Medication will be filled on 04/02/24.

## 2024-03-26 NOTE — Progress Notes (Signed)
 Office: [phone]  /  Fax: [phone]  WEIGHT SUMMARY AND BIOMETRICS  Anthropometric Measurements Height: 5' 5.5 (1.664 m) Weight: (!) 304 lb (137.9 kg) BMI (Calculated): 49.8 Weight at Last Visit: NA Weight Lost Since Last Visit: 0lb Weight Gained Since Last Visit: 0lb Starting Weight: 304 lb Total Weight Loss (lbs): 0 lb (0 kg) Peak Weight: 312 lb Waist Measurement : 50.5 inches   Body Composition  Body Fat %: 49.2 % Fat Mass (lbs): 149.8 lbs Muscle Mass (lbs): 146.8 lbs Total Body Water (lbs): 105.2 lbs Visceral Fat Rating : 18   Other Clinical Data RMR: 2621 Fasting: yes Labs: yes Today's Visit #: 1 Starting Date: 03/26/24 Comments: First Visit    Chief Complaint: OBESITY   History of Present Illness Shelia Bowers is a 53 year old female with obesity who presents for a workup to determine the most ideal treatment option.  She experiences fatigue that has worsened with weight gain and dyspnea on exertion, which is more pronounced than before. She previously used Saxenda for weight loss but discontinued it after contracting COVID-19 in late 2020.  She has a history of hyperlipidemia but is not currently on a statin. Her hypertension is currently controlled.  She has gluten intolerance, diagnosed approximately 11 years ago following a GI workup that revealed gastric ulcers. She experiences severe symptoms including pain, nausea, diarrhea, and migraines when consuming gluten. Her father had celiac disease, but her biopsy was negative for celiac.  She has chronic asthma and is currently being treated with Breztri . She previously experienced issues with eosinophilic esophagitis, which was managed with omeprazole  40 mg once daily at bedtime. She also receives monthly immunotherapy for allergies, which include cats, fall grasses, and mold.  Socially, she works from home and lives with her husband and family members who moved in during her recovery  from foot and leg surgery. She has a history of excessive hunger in the mornings and struggles with portion control, often eating quickly at her desk. She avoids fish, seafood, and curry but is open to trying mild-tasting fish like salmon or orange roughy. She has a preference for sweet iced tea and regular soda, which she considers her worst dietary habits.  Testing today includes:  Basal Metabolic Rate calculated via Bioimpedance scale 2216 Resting Energy Expenditure measured via Indirect Calorimeter 2621 and is higher  than expected. Epworth Sleepiness Score was WNL at 0       PHYSICAL EXAM:  Blood pressure 125/84, pulse 81, temperature (!) 97.4 F (36.3 C), height 5' 5.5 (1.664 m), weight (!) 304 lb (137.9 kg), SpO2 94%. Body mass index is 49.82 kg/m.  DIAGNOSTIC DATA REVIEWED:  BMET    Component Value Date/Time   NA 143 03/01/2024 0836   K 3.5 03/01/2024 0836   CL 104 03/01/2024 0836   CO2 29 03/01/2024 0836   GLUCOSE 112 (H) 03/01/2024 0836   BUN 13 03/01/2024 0836   CREATININE 0.81 03/01/2024 0836   CALCIUM 8.8 03/01/2024 0836   GFRNONAA >60 10/20/2021 1442   No results found for: HGBA1C No results found for: INSULIN Lab Results  Component Value Date   TSH 1.90 02/20/2024   CBC    Component Value Date/Time   WBC 8.4 02/20/2024 1243   RBC 4.50 02/20/2024 1243   HGB 13.8 02/20/2024 1243   HGB 13.8 01/16/2024 1702   HCT 41.5 02/20/2024 1243   HCT 42.7 01/16/2024 1702   PLT 246.0 02/20/2024 1243   PLT 228 01/16/2024  1702   MCV 92.2 02/20/2024 1243   MCV 95 01/16/2024 1702   MCH 30.8 01/16/2024 1702   MCH 30.6 10/20/2021 1442   MCHC 33.2 02/20/2024 1243   RDW 13.8 02/20/2024 1243   RDW 13.5 01/16/2024 1702   Iron Studies No results found for: IRON, TIBC, FERRITIN, IRONPCTSAT Lipid Panel     Component Value Date/Time   CHOL 284 (H) 02/20/2024 1243   TRIG 216.0 (H) 02/20/2024 1243   HDL 50.20 02/20/2024 1243   CHOLHDL 6 02/20/2024 1243    VLDL 43.2 (H) 02/20/2024 1243   LDLCALC 190 (H) 02/20/2024 1243   Hepatic Function Panel     Component Value Date/Time   PROT 7.4 02/20/2024 1243   ALBUMIN 4.4 02/20/2024 1243   AST 28 02/20/2024 1243   ALT 40 (H) 02/20/2024 1243   ALKPHOS 61 02/20/2024 1243   BILITOT 0.4 02/20/2024 1243      Component Value Date/Time   TSH 1.90 02/20/2024 1243   Nutritional Lab Results  Component Value Date   VD25OH 39.51 02/20/2024     Assessment and Plan Assessment & Plan Obesity with associated fatigue and dyspnea on exertion Obesity is being evaluated to determine the most effective treatment plan. Fatigue and dyspnea on exertion may be related to obesity but are being treated as separate diagnoses until further evaluation. Discussed the genetic predisposition to obesity and the body's mechanisms to resist weight loss. - Conduct a series of laboratory tests to evaluate obesity-related factors, including glucose levels and lipid profile. - Implement a diagnostic eating plan tailored to her needs, including gluten-free options. (Cat 3 with extra 200 cal to account for gluten free products) - Instruct to document hunger levels and food intake to identify excessive hunger signals. - Advise against weighing herself between appointments to focus on the eating plan rather than weight. - Reassess in two weeks to evaluate progress and adjust the plan as needed.  Hyperglycemia There is a history of fluctuating blood sugars, and further evaluation is planned to assess glucose levels as part of the obesity workup. - Check labs - Start Cat 3 plan  Hyperlipidemia Hyperlipidemia is noted, and she is not currently on a statin. Plan to evaluate her lipid levels further as part of the obesity workup. - Check labs - Start Cat 3 plan  Hypertension Hypertension is currently well-controlled with a blood pressure reading of 125/84 mmHg. - Check labs - Start Cat 3 plan  Gluten intolerance with gastric  ulcers and eosinophilic esophagitis Gluten intolerance is managed by avoiding gluten, which exacerbates symptoms such as nausea, pain, diarrhea, and migraines. Gastric ulcers and eosinophilic esophagitis are noted. Discussed the potential overlap with FODMAPs but concluded gluten is the primary issue. - Ensure the diagnostic eating plan is gluten-free. - Continue omeprazole  at bedtime for esophageal management.  Chronic asthma and allergic rhinitis/atopy Chronic asthma is managed with Breztri , and she is on regular allergy immunotherapy. Emphasized the importance of maintaining asthma control to avoid steroid use, which can affect weight management. - Continue Breztri  for asthma management. - Continue monthly allergy immunotherapy injections.  Postoperative state, lower extremity surgery (rehabilitation) She is currently rehabilitating from lower extremity surgery, including calf lengthening and plantar fascia release. - Advise against starting a new exercise regimen until further evaluation.     I personally spent a total of 51 minutes in the care of the patient today including preparing to see the patient, reviewing separately obtained history, performing a medically appropriate evaluation of current problems,  placing orders in the EMR, documenting clinical information in the EMR, customized nutritional counseling for their specific health and social needs, and explaining the pathophysiology of obesity and how it is significantly more complex than eat less and exercise more.    Bethanie was informed of the importance of frequent follow up visits to maximize her success with intensive lifestyle modifications for her obesity and obesity related health conditions as recommended by USPSTF and CMS guidelines   Louann Penton, MD

## 2024-03-28 LAB — PHOSPHORUS: Phosphorus: 3.2 mg/dL (ref 3.0–4.3)

## 2024-03-28 LAB — CMP14+EGFR
ALT: 36 IU/L — ABNORMAL HIGH (ref 0–32)
AST: 29 IU/L (ref 0–40)
Albumin: 4.5 g/dL (ref 3.8–4.9)
Alkaline Phosphatase: 75 IU/L (ref 49–135)
BUN/Creatinine Ratio: 11 (ref 9–23)
BUN: 10 mg/dL (ref 6–24)
Bilirubin Total: 0.5 mg/dL (ref 0.0–1.2)
CO2: 23 mmol/L (ref 20–29)
Calcium: 9.2 mg/dL (ref 8.7–10.2)
Chloride: 100 mmol/L (ref 96–106)
Creatinine, Ser: 0.92 mg/dL (ref 0.57–1.00)
Globulin, Total: 2.7 g/dL (ref 1.5–4.5)
Glucose: 101 mg/dL — ABNORMAL HIGH (ref 70–99)
Potassium: 4.1 mmol/L (ref 3.5–5.2)
Sodium: 140 mmol/L (ref 134–144)
Total Protein: 7.2 g/dL (ref 6.0–8.5)
eGFR: 74 mL/min/1.73 (ref >59)

## 2024-03-28 LAB — INSULIN, RANDOM: INSULIN: 32.2 u[IU]/mL — AB (ref 2.6–24.9)

## 2024-03-28 LAB — FOLATE: Folate: 11.7 ng/mL (ref >3.0)

## 2024-03-28 LAB — MAGNESIUM: Magnesium: 2.1 mg/dL (ref 1.6–2.3)

## 2024-03-28 LAB — HEMOGLOBIN A1C
Est. average glucose Bld gHb Est-mCnc: 123 mg/dL
Hgb A1c MFr Bld: 5.9 % — ABNORMAL HIGH (ref 4.8–5.6)

## 2024-04-02 ENCOUNTER — Other Ambulatory Visit: Payer: Self-pay

## 2024-04-02 ENCOUNTER — Other Ambulatory Visit (HOSPITAL_COMMUNITY): Payer: Self-pay

## 2024-04-02 LAB — GENECONNECT MOLECULAR SCREEN: Genetic Analysis Overall Interpretation: NEGATIVE

## 2024-04-05 ENCOUNTER — Ambulatory Visit (INDEPENDENT_AMBULATORY_CARE_PROVIDER_SITE_OTHER)

## 2024-04-05 DIAGNOSIS — J309 Allergic rhinitis, unspecified: Secondary | ICD-10-CM | POA: Diagnosis not present

## 2024-04-09 ENCOUNTER — Encounter (INDEPENDENT_AMBULATORY_CARE_PROVIDER_SITE_OTHER): Payer: Self-pay | Admitting: Family Medicine

## 2024-04-09 ENCOUNTER — Ambulatory Visit (INDEPENDENT_AMBULATORY_CARE_PROVIDER_SITE_OTHER): Admitting: Family Medicine

## 2024-04-09 VITALS — BP 120/73 | HR 72 | Temp 98.0°F | Ht 65.5 in | Wt 294.0 lb

## 2024-04-09 DIAGNOSIS — R7303 Prediabetes: Secondary | ICD-10-CM

## 2024-04-09 DIAGNOSIS — E785 Hyperlipidemia, unspecified: Secondary | ICD-10-CM

## 2024-04-09 DIAGNOSIS — E782 Mixed hyperlipidemia: Secondary | ICD-10-CM

## 2024-04-09 DIAGNOSIS — E669 Obesity, unspecified: Secondary | ICD-10-CM

## 2024-04-09 DIAGNOSIS — K760 Fatty (change of) liver, not elsewhere classified: Secondary | ICD-10-CM | POA: Diagnosis not present

## 2024-04-09 DIAGNOSIS — K9041 Non-celiac gluten sensitivity: Secondary | ICD-10-CM

## 2024-04-09 DIAGNOSIS — Z6841 Body Mass Index (BMI) 40.0 and over, adult: Secondary | ICD-10-CM

## 2024-04-09 MED ORDER — METFORMIN HCL 500 MG PO TABS: 500.0000 mg | ORAL_TABLET | Freq: Every day | ORAL | 0 refills | Status: DC

## 2024-04-09 NOTE — Addendum Note (Signed)
 Addended by: DELORES CASTOR A on: 04/09/2024 12:31 PM   Modules accepted: Level of Service

## 2024-04-09 NOTE — Progress Notes (Signed)
 Office: [phone]  /  Fax: [phone]  WEIGHT SUMMARY AND BIOMETRICS  Anthropometric Measurements Height: 5' 5.5 (1.664 m) Weight: 294 lb (133.4 kg) BMI (Calculated): 48.16 Weight at Last Visit: 304lb Weight Lost Since Last Visit: 10lb Weight Gained Since Last Visit: 0lb Starting Weight: 304lb Total Weight Loss (lbs): 10 lb (4.536 kg) Peak Weight: 312lb Waist Measurement : 50.5 inches   Body Composition  Body Fat %: 46.9 % Fat Mass (lbs): 138.2 lbs Muscle Mass (lbs): 148.6 lbs Total Body Water (lbs): 101.4 lbs Visceral Fat Rating : 17   Other Clinical Data RMR: 2621 Fasting: NO Labs: No Today's Visit #: 2 Starting Date: 03/26/24    Chief Complaint: OBESITY  History of Present Illness Shelia Bowers [name] is a 53 year old female who presents to discuss her test results for her obesity workup.  She is adhering to a category three eating plan 90% of the time and engages in walking for exercise almost daily for 30 minutes. Over the last two weeks, she has lost 10 pounds. Initially, she experienced hunger, particularly during the first week, with the hardest day being the Sunday of that week when she felt nauseous. By the second week, her hunger had decreased. She has substituted sugary drinks with options like ginger ale zero and has noticed a significant reduction in her consumption of such drinks.  Her recent test results show a fasting glucose level of 101, improved from 112 a month prior. Her hemoglobin A1c is 5.9. She has a history of elevated liver enzyme ALT, which was 40 a month ago and has decreased to 36. Her insulin level is 32. Her cholesterol has not been rechecked recently, but it was checked a month ago. Her kidney function and electrolytes are normal.  She manages her diet by adjusting meal timings and incorporating protein into her meals. She uses frozen meals with riced cauliflower and adds protein to meals like Lean Cuisines to meet her dietary  needs. She is mindful of her carbohydrate intake, focusing on complex carbohydrates and avoiding simple carbohydrates like bananas, grapes, and cherries.      PHYSICAL EXAM:  Blood pressure 120/73, pulse 72, temperature 98 F (36.7 C), height 5' 5.5 (1.664 m), weight 294 lb (133.4 kg), SpO2 98%. Body mass index is 48.18 kg/m.  DIAGNOSTIC DATA REVIEWED:  BMET    Component Value Date/Time   NA 140 03/26/2024 0844   K 4.1 03/26/2024 0844   CL 100 03/26/2024 0844   CO2 23 03/26/2024 0844   GLUCOSE 101 (H) 03/26/2024 0844   GLUCOSE 112 (H) 03/01/2024 0836   BUN 10 03/26/2024 0844   CREATININE 0.92 03/26/2024 0844   CALCIUM 9.2 03/26/2024 0844   GFRNONAA >60 10/20/2021 1442   Lab Results  Component Value Date   HGBA1C 5.9 (H) 03/26/2024   Lab Results  Component Value Date   INSULIN 32.2 (H) 03/26/2024   Lab Results  Component Value Date   TSH 1.90 02/20/2024   CBC    Component Value Date/Time   WBC 8.4 02/20/2024 1243   RBC 4.50 02/20/2024 1243   HGB 13.8 02/20/2024 1243   HGB 13.8 01/16/2024 1702   HCT 41.5 02/20/2024 1243   HCT 42.7 01/16/2024 1702   PLT 246.0 02/20/2024 1243   PLT 228 01/16/2024 1702   MCV 92.2 02/20/2024 1243   MCV 95 01/16/2024 1702   MCH 30.8 01/16/2024 1702   MCH 30.6 10/20/2021 1442   MCHC 33.2 02/20/2024 1243  RDW 13.8 02/20/2024 1243   RDW 13.5 01/16/2024 1702   Iron Studies No results found for: IRON, TIBC, FERRITIN, IRONPCTSAT Lipid Panel     Component Value Date/Time   CHOL 284 (H) 02/20/2024 1243   TRIG 216.0 (H) 02/20/2024 1243   HDL 50.20 02/20/2024 1243   CHOLHDL 6 02/20/2024 1243   VLDL 43.2 (H) 02/20/2024 1243   LDLCALC 190 (H) 02/20/2024 1243   Hepatic Function Panel     Component Value Date/Time   PROT 7.2 03/26/2024 0844   ALBUMIN 4.5 03/26/2024 0844   AST 29 03/26/2024 0844   ALT 36 (H) 03/26/2024 0844   ALKPHOS 75 03/26/2024 0844   BILITOT 0.5 03/26/2024 0844      Component Value Date/Time    TSH 1.90 02/20/2024 1243   Nutritional Lab Results  Component Value Date   VD25OH 39.51 02/20/2024     Assessment and Plan Assessment & Plan Obesity with insulin resistance and prediabetes Obesity with significant insulin resistance and prediabetes. Fasting glucose was 101, slightly above the ideal of below 100. Hemoglobin A1c was 5.9, indicating prediabetes. Insulin level was 32, significantly elevated, indicating insulin resistance. She has lost 10 pounds in the last two weeks, with 6 pounds being fat loss, including visceral fat. The current eating plan is effective in reducing hunger and promoting weight loss. - Continue current eating plan and exercise regimen. - Prescribe metformin to improve insulin sensitivity and reduce the risk of developing diabetes. Discussed benefits, including potential to reduce hunger and anti-inflammatory properties. Common side effects include gastrointestinal upset if taken on an empty stomach and diarrhea if excessive carbohydrates are consumed. - Provide handout on insulin resistance and prediabetes. -Start metformin 500 mg qam - Schedule follow-up appointments to monitor progress.  Fatty liver disease Elevated ALT level, previously 40, now 36, with a goal of below 30. The elevation is likely due to fatty liver. Weight loss is the primary treatment, and she is already making progress in this area. - Continue weight loss efforts to reduce liver fat. - Continue diet, exercise and weight loss as discussed today as an important part of the treatment plan   Hyperlipidemia Cholesterol levels were not rechecked as it was too early. Plan to re-evaluate in three months to assess the impact of current lifestyle changes on lipid levels. - Recheck cholesterol levels in three months. - Continue diet, exercise and weight loss as discussed today as an important part of the treatment plan      I personally spent a total of 40 minutes in the care of the patient  today including preparing to see the patient, performing a medically appropriate evaluation of current problems, placing orders in the EMR, documenting clinical information in the EMR, customized nutritional counseling for their specific health and social needs, discussing results with the patient and educating them on how these results can affect their health and weight, and explaining the pathophysiology of obesity and how it is significantly more complex than eat less and exercise more.    Nemesis was informed of the importance of frequent follow up visits to maximize her success with intensive lifestyle modifications for her obesity and obesity related health conditions as recommended by USPSTF and CMS guidelines   Louann Penton, MD

## 2024-04-23 ENCOUNTER — Other Ambulatory Visit: Payer: Self-pay

## 2024-04-23 ENCOUNTER — Ambulatory Visit (INDEPENDENT_AMBULATORY_CARE_PROVIDER_SITE_OTHER): Admitting: Family Medicine

## 2024-04-23 ENCOUNTER — Encounter (INDEPENDENT_AMBULATORY_CARE_PROVIDER_SITE_OTHER): Payer: Self-pay | Admitting: Family Medicine

## 2024-04-23 ENCOUNTER — Encounter (INDEPENDENT_AMBULATORY_CARE_PROVIDER_SITE_OTHER): Payer: Self-pay

## 2024-04-23 VITALS — BP 127/84 | HR 80 | Temp 98.3°F | Ht 65.5 in | Wt 291.0 lb

## 2024-04-23 DIAGNOSIS — I10 Essential (primary) hypertension: Secondary | ICD-10-CM | POA: Diagnosis not present

## 2024-04-23 DIAGNOSIS — R7303 Prediabetes: Secondary | ICD-10-CM

## 2024-04-23 DIAGNOSIS — K219 Gastro-esophageal reflux disease without esophagitis: Secondary | ICD-10-CM | POA: Diagnosis not present

## 2024-04-23 DIAGNOSIS — E669 Obesity, unspecified: Secondary | ICD-10-CM | POA: Diagnosis not present

## 2024-04-23 DIAGNOSIS — Z6841 Body Mass Index (BMI) 40.0 and over, adult: Secondary | ICD-10-CM

## 2024-04-23 MED ORDER — METFORMIN HCL 500 MG PO TABS: 500.0000 mg | ORAL_TABLET | Freq: Two times a day (BID) | ORAL | 0 refills | Status: DC

## 2024-04-23 NOTE — Progress Notes (Signed)
 Clinical Intervention Note  Clinical Intervention Notes: Patient reported starting metformin. No DDIs identified with Tezspire .   Clinical Intervention Outcomes: Prevention of an adverse drug event   Advertising Account Planner

## 2024-04-23 NOTE — Progress Notes (Signed)
 Specialty Pharmacy Refill Coordination Note  Shelia Bowers is a 53 y.o. female contacted today regarding refills of specialty medication(s) Tezepelumab -ekko (Tezspire )   Patient requested Delivery   Delivery date: 05/03/24   Verified address: [address], [place], [place] [address]   Medication will be filled on: 05/02/24

## 2024-04-23 NOTE — Progress Notes (Signed)
 Office: [phone]  /  Fax: [phone]  WEIGHT SUMMARY AND BIOMETRICS  Anthropometric Measurements Height: 5' 5.5 (1.664 m) Weight: 291 lb (132 kg) BMI (Calculated): 47.67 Weight at Last Visit: 294 lb Weight Lost Since Last Visit: 3 lb Weight Gained Since Last Visit: 0 Starting Weight: 304 lb Total Weight Loss (lbs): 13 lb (5.897 kg) Peak Weight: 312 lb Waist Measurement : 50.5 inches   Body Composition  Body Fat %: 53.3 % Fat Mass (lbs): 155.2 lbs Muscle Mass (lbs): 129.2 lbs Total Body Water (lbs): 100.8 lbs Visceral Fat Rating : 19   Other Clinical Data RMR: 2621 Fasting: no Labs: no Today's Visit #: 3 Starting Date: 03/26/24    Chief Complaint: OBESITY    History of Present Illness Shelia Bowers is a 53 year old female with obesity, hypertension, and prediabetes who presents for obesity treatment and progress assessment.  She is adhering to a category three eating plan with 95% compliance, focusing on increasing her intake of fruits, vegetables, and protein. She maintains adequate hydration, avoids skipping meals, and generally gets 7 to 9 hours of sleep per night. She exercises for 30 minutes daily and has lost 3 pounds in the last two weeks, with 2 pounds being fat and 1 pound water.  She is on losartan  hydrochlorothiazide 50/12.5 mg and is working on reducing sodium intake, weight loss, and exercise to manage her hypertension.  She has prediabetes and is on metformin, which she takes with breakfast. No significant change in hunger levels with metformin and no gastrointestinal issues. She is working on reducing simple carbohydrates and increasing exercise to prevent progression to type 2 diabetes. She requests a refill of metformin today.  Her heartburn has improved, now only requiring medication once in the evening instead of twice daily.      PHYSICAL EXAM:  Blood pressure 127/84, pulse 80, temperature 98.3 F (36.8 C), height 5' 5.5  (1.664 m), weight 291 lb (132 kg), SpO2 95%. Body mass index is 47.69 kg/m.  DIAGNOSTIC DATA REVIEWED:  BMET    Component Value Date/Time   NA 140 03/26/2024 0844   K 4.1 03/26/2024 0844   CL 100 03/26/2024 0844   CO2 23 03/26/2024 0844   GLUCOSE 101 (H) 03/26/2024 0844   GLUCOSE 112 (H) 03/01/2024 0836   BUN 10 03/26/2024 0844   CREATININE 0.92 03/26/2024 0844   CALCIUM 9.2 03/26/2024 0844   GFRNONAA >60 10/20/2021 1442   Lab Results  Component Value Date   HGBA1C 5.9 (H) 03/26/2024   Lab Results  Component Value Date   INSULIN 32.2 (H) 03/26/2024   Lab Results  Component Value Date   TSH 1.90 02/20/2024   CBC    Component Value Date/Time   WBC 8.4 02/20/2024 1243   RBC 4.50 02/20/2024 1243   HGB 13.8 02/20/2024 1243   HGB 13.8 01/16/2024 1702   HCT 41.5 02/20/2024 1243   HCT 42.7 01/16/2024 1702   PLT 246.0 02/20/2024 1243   PLT 228 01/16/2024 1702   MCV 92.2 02/20/2024 1243   MCV 95 01/16/2024 1702   MCH 30.8 01/16/2024 1702   MCH 30.6 10/20/2021 1442   MCHC 33.2 02/20/2024 1243   RDW 13.8 02/20/2024 1243   RDW 13.5 01/16/2024 1702   Iron Studies No results found for: IRON, TIBC, FERRITIN, IRONPCTSAT Lipid Panel     Component Value Date/Time   CHOL 284 (H) 02/20/2024 1243   TRIG 216.0 (H) 02/20/2024 1243   HDL 50.20 02/20/2024  1243   CHOLHDL 6 02/20/2024 1243   VLDL 43.2 (H) 02/20/2024 1243   LDLCALC 190 (H) 02/20/2024 1243   Hepatic Function Panel     Component Value Date/Time   PROT 7.2 03/26/2024 0844   ALBUMIN 4.5 03/26/2024 0844   AST 29 03/26/2024 0844   ALT 36 (H) 03/26/2024 0844   ALKPHOS 75 03/26/2024 0844   BILITOT 0.5 03/26/2024 0844      Component Value Date/Time   TSH 1.90 02/20/2024 1243   Nutritional Lab Results  Component Value Date   VD25OH 39.51 02/20/2024     Assessment and Plan Assessment & Plan Obesity Management is ongoing with a focus on dietary changes and exercise. She adheres to the category  three eating plan 95% of the time, incorporating more fruits, vegetables, and protein, and maintains adequate hydration. She exercises 30 minutes daily and has lost 3 pounds in the last two weeks, with 2 pounds being fat and 1 pound water. Current exercise primarily involves cardio, and there is a need to incorporate strengthening exercises to prevent muscle mass loss and maintain metabolism. - Continue category three eating plan. - Increase exercise to include strengthening activities using ankle weights or a weighted vest. - Start with 3 pounds of weight and adjust as needed. - Scheduled follow-up appointment with physician assistant on November 8th at 7:30 AM.  Essential hypertension Hypertension is well controlled with a blood pressure of 127/84 mmHg. She is on losartan  hydrochlorothiazide 50/12.5 mg and is working on reducing sodium intake, weight loss, and exercise to further improve blood pressure control. - Continue losartan  hydrochlorothiazide 50/12.5 mg. - Continue dietary sodium reduction and weight loss efforts.  Prediabetes Managed with metformin and lifestyle modifications. She is working on decreasing simple carbohydrates and increasing exercise to prevent progression to type 2 diabetes. Current metformin dose may not be sufficient for optimal benefit. - Increased metformin to twice daily dosing, with one dose at breakfast and another at lunch. - Refilled metformin prescription at [facility] in [place].  Gastroesophageal reflux disease (GERD) without esophagitis GERD symptoms are well controlled with current management. She takes medication in the evening and reports improvement in symptoms with dietary changes. - Continue current GERD management with evening medication.    Shelia Bowers was counseled on the importance of maintaining healthy lifestyle habits, including balanced nutrition, regular physical activity, and behavioral modifications, while taking antiobesity medication.  Patient  verbalized understanding that medication is an adjunct to, not a replacement for, lifestyle changes and that the long-term success and weight maintenance depend on continued adherence to these strategies.   Shelia Bowers was informed of the importance of frequent follow up visits to maximize her success with intensive lifestyle modifications for her obesity and obesity related health conditions as recommended by USPSTF and CMS guidelines   Louann Penton, MD

## 2024-05-01 ENCOUNTER — Other Ambulatory Visit (INDEPENDENT_AMBULATORY_CARE_PROVIDER_SITE_OTHER)

## 2024-05-01 DIAGNOSIS — E7800 Pure hypercholesterolemia, unspecified: Secondary | ICD-10-CM | POA: Diagnosis not present

## 2024-05-01 LAB — LIPID PANEL
Cholesterol: 184 mg/dL (ref 0–200)
HDL: 50.1 mg/dL (ref >39.00)
LDL Cholesterol: 114 mg/dL — ABNORMAL HIGH (ref 0–99)
NonHDL: 133.56
Total CHOL/HDL Ratio: 4
Triglycerides: 96 mg/dL (ref 0.0–149.0)
VLDL: 19.2 mg/dL (ref 0.0–40.0)

## 2024-05-02 ENCOUNTER — Other Ambulatory Visit: Payer: Self-pay

## 2024-05-06 ENCOUNTER — Ambulatory Visit (INDEPENDENT_AMBULATORY_CARE_PROVIDER_SITE_OTHER): Payer: Self-pay | Admitting: Family Medicine

## 2024-05-06 ENCOUNTER — Ambulatory Visit

## 2024-05-06 ENCOUNTER — Encounter (INDEPENDENT_AMBULATORY_CARE_PROVIDER_SITE_OTHER): Payer: Self-pay | Admitting: Family Medicine

## 2024-05-06 VITALS — BP 106/72 | HR 68 | Temp 97.8°F | Ht 65.5 in | Wt 286.0 lb

## 2024-05-06 DIAGNOSIS — J4550 Severe persistent asthma, uncomplicated: Secondary | ICD-10-CM

## 2024-05-06 DIAGNOSIS — E669 Obesity, unspecified: Secondary | ICD-10-CM | POA: Diagnosis not present

## 2024-05-06 DIAGNOSIS — J309 Allergic rhinitis, unspecified: Secondary | ICD-10-CM | POA: Diagnosis not present

## 2024-05-06 DIAGNOSIS — Z6841 Body Mass Index (BMI) 40.0 and over, adult: Secondary | ICD-10-CM

## 2024-05-06 DIAGNOSIS — K9041 Non-celiac gluten sensitivity: Secondary | ICD-10-CM

## 2024-05-06 DIAGNOSIS — J45909 Unspecified asthma, uncomplicated: Secondary | ICD-10-CM

## 2024-05-06 DIAGNOSIS — R7303 Prediabetes: Secondary | ICD-10-CM

## 2024-05-06 MED ORDER — METFORMIN HCL 500 MG PO TABS: 500.0000 mg | ORAL_TABLET | Freq: Two times a day (BID) | ORAL | 1 refills | Status: DC

## 2024-05-06 NOTE — Progress Notes (Signed)
 Office: [phone]  /  Fax: [phone]  WEIGHT SUMMARY AND BIOMETRICS  Anthropometric Measurements Height: 5' 5.5 (1.664 m) Weight: 286 lb (129.7 kg) BMI (Calculated): 46.85 Weight at Last Visit: 291 lb Weight Lost Since Last Visit: 5 lb Weight Gained Since Last Visit: 0 Starting Weight: 304 lb Total Weight Loss (lbs): 18 lb (8.165 kg) Peak Weight: 312 lb Waist Measurement : 50.5 inches   Body Composition  Body Fat %: 53.1 % Fat Mass (lbs): 152 lbs Muscle Mass (lbs): 127.6 lbs Total Body Water (lbs): 98.6 lbs Visceral Fat Rating : 19   Other Clinical Data RMR: 2621 Fasting: no Labs: no Today's Visit #: 4 Starting Date: 03/26/24    Chief Complaint: OBESITY    History of Present Illness Shelia Bowers is a 53 year old female with obesity and prediabetes who presents for a follow-up on her obesity treatment and progress.  She is adhering to a category three eating plan about 95% of the time, focusing on whole foods, including fruits and vegetables, and consuming the recommended amount of protein. She is adequately hydrating and not skipping meals, resulting in a weight loss of five pounds over the past two weeks.  She is managing her prediabetes with metformin, taking 500 mg twice daily, and requests a refill. She has a gluten intolerance and actively avoids gluten-containing foods, although she has not been diagnosed with celiac disease.  Her exercise routine includes daily walking for 30 minutes, and she has recently added a weighted vest to her regimen. She generally sleeps seven to nine hours per night.  She plans to visit her in-laws for Thanksgiving in [facility] and is accustomed to preparing gluten-free meals for herself when necessary.  No issues with asthma and has not needed to take allergy medications this fall, which is unusual for her. She continues to use [facility] for asthma management.      PHYSICAL EXAM:  Blood pressure 106/72,  pulse 68, temperature 97.8 F (36.6 C), height 5' 5.5 (1.664 m), weight 286 lb (129.7 kg), SpO2 96%. Body mass index is 46.87 kg/m.  DIAGNOSTIC DATA REVIEWED:  BMET    Component Value Date/Time   NA 140 03/26/2024 0844   K 4.1 03/26/2024 0844   CL 100 03/26/2024 0844   CO2 23 03/26/2024 0844   GLUCOSE 101 (H) 03/26/2024 0844   GLUCOSE 112 (H) 03/01/2024 0836   BUN 10 03/26/2024 0844   CREATININE 0.92 03/26/2024 0844   CALCIUM 9.2 03/26/2024 0844   GFRNONAA >60 10/20/2021 1442   Lab Results  Component Value Date   HGBA1C 5.9 (H) 03/26/2024   Lab Results  Component Value Date   INSULIN 32.2 (H) 03/26/2024   Lab Results  Component Value Date   TSH 1.90 02/20/2024   CBC    Component Value Date/Time   WBC 8.4 02/20/2024 1243   RBC 4.50 02/20/2024 1243   HGB 13.8 02/20/2024 1243   HGB 13.8 01/16/2024 1702   HCT 41.5 02/20/2024 1243   HCT 42.7 01/16/2024 1702   PLT 246.0 02/20/2024 1243   PLT 228 01/16/2024 1702   MCV 92.2 02/20/2024 1243   MCV 95 01/16/2024 1702   MCH 30.8 01/16/2024 1702   MCH 30.6 10/20/2021 1442   MCHC 33.2 02/20/2024 1243   RDW 13.8 02/20/2024 1243   RDW 13.5 01/16/2024 1702   Iron Studies No results found for: IRON, TIBC, FERRITIN, IRONPCTSAT Lipid Panel     Component Value Date/Time   CHOL  184 05/01/2024 0745   TRIG 96.0 05/01/2024 0745   HDL 50.10 05/01/2024 0745   CHOLHDL 4 05/01/2024 0745   VLDL 19.2 05/01/2024 0745   LDLCALC 114 (H) 05/01/2024 0745   Hepatic Function Panel     Component Value Date/Time   PROT 7.2 03/26/2024 0844   ALBUMIN 4.5 03/26/2024 0844   AST 29 03/26/2024 0844   ALT 36 (H) 03/26/2024 0844   ALKPHOS 75 03/26/2024 0844   BILITOT 0.5 03/26/2024 0844      Component Value Date/Time   TSH 1.90 02/20/2024 1243   Nutritional Lab Results  Component Value Date   VD25OH 39.51 02/20/2024     Assessment and Plan Assessment & Plan Obesity Management is ongoing with adherence to the category  three eating plan, resulting in a 5-pound weight loss over the last two weeks. She is incorporating whole foods, adequate hydration, and regular exercise, including walking with a weighted vest. Resting metabolic rate is 7399 calories per day. - Continue category three eating plan with increased freedom for dinner options. - Ensure dinner calories are between 500-700 and protein intake is at least 50 grams. - Provided handout with dinner options and recipes. - Encouraged tracking of meal calories and protein intake. - Scheduled follow-up appointment on November 24th.  Prediabetes Managed with metformin 500 mg twice daily. No issues reported with medication adherence. - Refilled metformin 500 mg twice daily.  Gluten intolerance (non-celiac) Gluten intolerance is managed by avoiding gluten-rich foods. No diagnosis of celiac disease. Nutritional advice will avoid gluten-rich foods. - Continue gluten-free diet. - Provided nutritional advice avoiding gluten-rich foods.  Asthma Well-controlled with no recent exacerbations or need for allergy medications despite the fall season.     Shelia Bowers was counseled on the importance of maintaining healthy lifestyle habits, including balanced nutrition, regular physical activity, and behavioral modifications, while taking antiobesity medication.  Patient verbalized understanding that medication is an adjunct to, not a replacement for, lifestyle changes and that the long-term success and weight maintenance depend on continued adherence to these strategies.   Shelia Bowers was informed of the importance of frequent follow up visits to maximize her success with intensive lifestyle modifications for her obesity and obesity related health conditions as recommended by USPSTF and CMS guidelines   Louann Penton, MD

## 2024-05-15 ENCOUNTER — Ambulatory Visit (INDEPENDENT_AMBULATORY_CARE_PROVIDER_SITE_OTHER): Admitting: Podiatry

## 2024-05-15 ENCOUNTER — Ambulatory Visit: Admitting: Nurse Practitioner

## 2024-05-15 VITALS — Ht 65.5 in | Wt 286.0 lb

## 2024-05-15 DIAGNOSIS — M7671 Peroneal tendinitis, right leg: Secondary | ICD-10-CM

## 2024-05-15 NOTE — Progress Notes (Signed)
  Subjective:  Patient ID: Shelia Bowers, female    DOB: 1971/04/03,  MRN: [id number]  Chief Complaint  Patient presents with   Tendonitis    Rm 4 Patient is here to f/u on left ankle pain/tendonitis.  Pt states a great improvement in pain, still some weakness/instability with prolonged standing/walking.    53 y.o. female presents with the above complaint. History confirmed with patient.  Doing much better  Objective:  Physical Exam: warm, good capillary refill, no trophic changes or ulcerative lesions, normal DP and PT pulses, normal sensory exam, and all slight tenderness in the posterior calf no pain in the peroneals today   Assessment:   1. Peroneal tendinitis, right leg      Plan:  Patient was evaluated and treated and all questions answered.  Doing much better.  Continue bracing and home physical therapy as needed.  Follow APRN for this and other issues.  Return if symptoms worsen or fail to improve.

## 2024-05-20 ENCOUNTER — Ambulatory Visit (INDEPENDENT_AMBULATORY_CARE_PROVIDER_SITE_OTHER): Payer: Self-pay | Admitting: Family Medicine

## 2024-05-20 ENCOUNTER — Encounter (INDEPENDENT_AMBULATORY_CARE_PROVIDER_SITE_OTHER): Payer: Self-pay | Admitting: Family Medicine

## 2024-05-20 VITALS — BP 124/80 | HR 69 | Temp 97.7°F | Ht 65.5 in | Wt 280.0 lb

## 2024-05-20 DIAGNOSIS — E669 Obesity, unspecified: Secondary | ICD-10-CM

## 2024-05-20 DIAGNOSIS — G5703 Lesion of sciatic nerve, bilateral lower limbs: Secondary | ICD-10-CM | POA: Diagnosis not present

## 2024-05-20 DIAGNOSIS — Z6841 Body Mass Index (BMI) 40.0 and over, adult: Secondary | ICD-10-CM

## 2024-05-20 DIAGNOSIS — K219 Gastro-esophageal reflux disease without esophagitis: Secondary | ICD-10-CM

## 2024-05-20 DIAGNOSIS — R7303 Prediabetes: Secondary | ICD-10-CM | POA: Diagnosis not present

## 2024-05-20 MED ORDER — METFORMIN HCL 500 MG PO TABS: 500.0000 mg | ORAL_TABLET | Freq: Two times a day (BID) | ORAL | 1 refills | Status: DC

## 2024-05-20 NOTE — Progress Notes (Signed)
 Office: [phone]  /  Fax: [phone]  WEIGHT SUMMARY AND BIOMETRICS  Anthropometric Measurements Height: 5' 5.5 (1.664 m) Weight: 280 lb (127 kg) BMI (Calculated): 45.87 Weight at Last Visit: 286 lb Weight Lost Since Last Visit: 6 lb Weight Gained Since Last Visit: 0 Starting Weight: 304 lb Total Weight Loss (lbs): 24 lb (10.9 kg) Peak Weight: 312 lb Waist Measurement : 50.5 inches   Body Composition  Body Fat %: 46.3 % Fat Mass (lbs): 129.8 lbs Muscle Mass (lbs): 143 lbs Total Body Water (lbs): 100.4 lbs Visceral Fat Rating : 16   Other Clinical Data RMR: 2621 Fasting: no Labs: no Today's Visit #: 5 Starting Date: 03/26/24    Chief Complaint: OBESITY    History of Present Illness Shelia Bowers is a 53 year old female who presents for obesity treatment follow-up and progress assessment.  She adheres to a category three eating plan 90% of the time and engages in daily physical activity by walking her dog with a weighted vest for 30 minutes. She has achieved a weight loss of 6 pounds in the last two weeks and a total of 24 pounds since starting the program. Her BMI has decreased from 50.13 to 45.  She experiences increased hunger after taking the second dose of metformin  at lunch, describing it as feeling like she has a 'tapeworm'. Despite this sensation, she continues to take the second dose.  She uses a weighted vest that is 4 pounds without added weight and can add up to 6 additional pounds, ensuring the weight is evenly distributed to maintain balance.  She plans to visit her in-laws in [place], [place]  for Thanksgiving and will not have leftovers at home, which she believes will help manage her eating during the holiday.  She has reduced her Prilosec dosage and is no longer taking Mobic , which was initially prescribed post-surgery for her calf. She reports no longer needing it after her plantar fascia release and calf lengthening  surgery.  She experiences numbness in her legs when standing for extended periods, particularly during food preparation. The numbness is located on the front of her legs and is relieved by sitting or shifting positions.      PHYSICAL EXAM:  Blood pressure 124/80, pulse 69, temperature 97.7 F (36.5 C), height 5' 5.5 (1.664 m), weight 280 lb (127 kg), SpO2 95%. Body mass index is 45.89 kg/m.  DIAGNOSTIC DATA REVIEWED:  BMET    Component Value Date/Time   NA 140 03/26/2024 0844   K 4.1 03/26/2024 0844   CL 100 03/26/2024 0844   CO2 23 03/26/2024 0844   GLUCOSE 101 (H) 03/26/2024 0844   GLUCOSE 112 (H) 03/01/2024 0836   BUN 10 03/26/2024 0844   CREATININE 0.92 03/26/2024 0844   CALCIUM 9.2 03/26/2024 0844   GFRNONAA >60 10/20/2021 1442   Lab Results  Component Value Date   HGBA1C 5.9 (H) 03/26/2024   Lab Results  Component Value Date   INSULIN  32.2 (H) 03/26/2024   Lab Results  Component Value Date   TSH 1.90 02/20/2024   CBC    Component Value Date/Time   WBC 8.4 02/20/2024 1243   RBC 4.50 02/20/2024 1243   HGB 13.8 02/20/2024 1243   HGB 13.8 01/16/2024 1702   HCT 41.5 02/20/2024 1243   HCT 42.7 01/16/2024 1702   PLT 246.0 02/20/2024 1243   PLT 228 01/16/2024 1702   MCV 92.2 02/20/2024 1243   MCV 95 01/16/2024 1702  MCH 30.8 01/16/2024 1702   MCH 30.6 10/20/2021 1442   MCHC 33.2 02/20/2024 1243   RDW 13.8 02/20/2024 1243   RDW 13.5 01/16/2024 1702   Iron Studies No results found for: IRON, TIBC, FERRITIN, IRONPCTSAT Lipid Panel     Component Value Date/Time   CHOL 184 05/01/2024 0745   TRIG 96.0 05/01/2024 0745   HDL 50.10 05/01/2024 0745   CHOLHDL 4 05/01/2024 0745   VLDL 19.2 05/01/2024 0745   LDLCALC 114 (H) 05/01/2024 0745   Hepatic Function Panel     Component Value Date/Time   PROT 7.2 03/26/2024 0844   ALBUMIN 4.5 03/26/2024 0844   AST 29 03/26/2024 0844   ALT 36 (H) 03/26/2024 0844   ALKPHOS 75 03/26/2024 0844   BILITOT  0.5 03/26/2024 0844      Component Value Date/Time   TSH 1.90 02/20/2024 1243   Nutritional Lab Results  Component Value Date   VD25OH 39.51 02/20/2024     Assessment and Plan Assessment & Plan Obesity and prediabetes Management is ongoing with significant progress. She has lost 24 pounds since starting the program, with 6 pounds lost in the last two weeks. She adheres to the category three eating plan 90% of the time and engages in regular physical activity, including walking with a weighted vest seven days a week for 30 minutes. She experiences increased hunger after the second dose of metformin , which may be mitigated by changing the timing of the dose to dinner. Strategies for managing eating during Thanksgiving were discussed, emphasizing smaller portions and mindful eating to reduce caloric intake by 25%. - Continue category three eating plan. - Continue walking with a weighted vest seven days a week for 30 minutes. - Change timing of second metformin  dose to dinner. - Implement strategies for mindful eating during Thanksgiving. - Scheduled follow-up appointment in February.  Piriformis syndrome She reports numbness and tingling in the legs, particularly when standing, consistent with piriformis syndrome. Symptoms are more pronounced in one leg. The condition is likely due to the sciatic nerve being pinched by tight muscles in the pelvis. Stretching exercises were demonstrated to both diagnose and potentially alleviate symptoms. - Perform stretching exercises for piriformis syndrome.  Gastroesophageal reflux disease She is on a reduced dose of Prilosec, which is effective in managing her symptoms. She is no longer taking Mobic , as it was previously used post-surgery for plantar fascia release and calf lengthening. - Continue current dose of Prilosec.      Patients who are on anti-obesity medications are counseled on the importance of maintaining healthy lifestyle habits,  including balanced nutrition, regular physical activity, and behavioral modifications,  Medication is an adjunct to, not a replacement for, lifestyle changes and that the long-term success and weight maintenance depend on continued adherence to these strategies.   Mikailah was informed of the importance of frequent follow up visits to maximize her success with intensive lifestyle modifications for her obesity and obesity related health conditions as recommended by USPSTF and CMS guidelines   Louann Penton, MD

## 2024-05-21 ENCOUNTER — Ambulatory Visit (INDEPENDENT_AMBULATORY_CARE_PROVIDER_SITE_OTHER): Admitting: Nurse Practitioner

## 2024-05-21 ENCOUNTER — Encounter: Payer: Self-pay | Admitting: Nurse Practitioner

## 2024-05-21 ENCOUNTER — Other Ambulatory Visit: Payer: Self-pay

## 2024-05-21 VITALS — BP 116/76 | HR 90 | Ht 65.25 in | Wt 284.0 lb

## 2024-05-21 DIAGNOSIS — N911 Secondary amenorrhea: Secondary | ICD-10-CM | POA: Diagnosis not present

## 2024-05-21 DIAGNOSIS — N912 Amenorrhea, unspecified: Secondary | ICD-10-CM | POA: Diagnosis not present

## 2024-05-21 LAB — FOLLICLE STIMULATING HORMONE: FSH: 33.9 m[IU]/mL

## 2024-05-21 LAB — PROLACTIN: Prolactin: 6.8 ng/mL

## 2024-05-21 NOTE — Progress Notes (Signed)
 Specialty Pharmacy Refill Coordination Note  Shelia Bowers is a 53 y.o. female contacted today regarding refills of specialty medication(s) Tezepelumab -ekko (Tezspire )   Patient requested Delivery   Delivery date: 05/30/24   Verified address: [address], [place], [place] [address]   Medication will be filled on: 05/29/24

## 2024-05-21 NOTE — Progress Notes (Signed)
   Acute Office Visit  Subjective:    Patient ID: Shelia Bowers, female    DOB: 1970/09/18, 52 y.o.   MRN: [id number]   HPI 53 y.o. presents today for secondary amenorrhea. Referred by PCP. LMP in November 2020. Had Covid at that time with fever for 21 days. Menses were regular prior to that. Ranged in heaviness and cramping but normal overall. 02/20/24 FSH 17, normal TSH. Normal ultrasound 03/05/24, endometrium 5 mm. No bleeding.   Ultrasound 03/05/2024:  FINDINGS: Uterus   Measurements: 2.5 x 5.6 x 3.8 cm = volume: 83 mL. No fibroids or other mass visualized.   Endometrium   Thickness: 5 mm.  No focal abnormality visualized.   Right ovary   Measurements: 3.2 x 2.8 x 2.3 cm = volume: 11 mL. Normal appearance/no adnexal mass.   Left ovary   Not visualized.   Other findings   No free fluid.   IMPRESSION: Left ovary is not visualized. No definite abnormality seen in the pelvis.  No LMP recorded. Patient is postmenopausal.    Review of Systems  Constitutional: Negative.   Genitourinary: Negative.        Objective:    Physical Exam Constitutional:      Appearance: Normal appearance.   GU: Not indicated  BP 116/76 (BP Location: Left Arm, Patient Position: Sitting, Cuff Size: Large)   Pulse 90   Ht 5' 5.25 (1.657 m)   Wt 284 lb (128.8 kg)   SpO2 96%   BMI 46.90 kg/m  Wt Readings from Last 3 Encounters:  05/21/24 284 lb (128.8 kg)  05/20/24 280 lb (127 kg)  05/15/24 286 lb (129.7 kg)        Assessment & Plan:   Problem List Items Addressed This Visit   None Visit Diagnoses       Secondary amenorrhea    -  Primary   Relevant Orders   Follicle stimulating hormone   Estradiol, Ultra Sens   Prolactin      Plan: Reviewed ultrasound report and labs performed by PCP. Will recheck [facility] today, as well as estradiol and prolactin. Previous FSH in premenopausal range. Follow up to be determined.   I personally spent a total of 25 minutes in the care of  the patient today including preparing to see the patient, placing orders, documenting clinical information in the EHR, independently interpreting results, and communicating results.     Shelia DELENA Shutter DNP, 11:06 AM 05/21/2024

## 2024-05-27 LAB — ESTRADIOL, ULTRA SENS: Estradiol, Ultra Sensitive: 5 pg/mL

## 2024-05-28 ENCOUNTER — Ambulatory Visit: Payer: Self-pay | Admitting: Nurse Practitioner

## 2024-05-29 ENCOUNTER — Other Ambulatory Visit: Payer: Self-pay

## 2024-05-29 ENCOUNTER — Ambulatory Visit (INDEPENDENT_AMBULATORY_CARE_PROVIDER_SITE_OTHER): Admitting: Family Medicine

## 2024-05-29 ENCOUNTER — Encounter: Payer: Self-pay | Admitting: Family Medicine

## 2024-05-29 VITALS — BP 96/64 | HR 73 | Temp 98.3°F | Ht 65.25 in | Wt 280.0 lb

## 2024-05-29 DIAGNOSIS — R202 Paresthesia of skin: Secondary | ICD-10-CM | POA: Insufficient documentation

## 2024-05-29 DIAGNOSIS — E7800 Pure hypercholesterolemia, unspecified: Secondary | ICD-10-CM

## 2024-05-29 DIAGNOSIS — I10 Essential (primary) hypertension: Secondary | ICD-10-CM | POA: Diagnosis not present

## 2024-05-29 DIAGNOSIS — M545 Low back pain, unspecified: Secondary | ICD-10-CM | POA: Insufficient documentation

## 2024-05-29 DIAGNOSIS — M5442 Lumbago with sciatica, left side: Secondary | ICD-10-CM

## 2024-05-29 MED ORDER — LOSARTAN POTASSIUM-HCTZ 50-12.5 MG PO TABS: 0.5000 | ORAL_TABLET | Freq: Every day | ORAL | 3 refills | Status: AC

## 2024-05-29 MED ORDER — LOSARTAN POTASSIUM-HCTZ 50-12.5 MG PO TABS: 10.5000 | ORAL_TABLET | Freq: Every day | ORAL | 3 refills | Status: DC

## 2024-05-29 NOTE — Assessment & Plan Note (Signed)
 Blood pressure is lower with weight loss BP: 96/64  Great lifestyle habits  Plan to cut losartan  hct in 1/2  for 25-6.25 total daily Pt will check at home and also at the [facility] Encouraged to keep up the great work

## 2024-05-29 NOTE — Assessment & Plan Note (Addendum)
 Midline low back  Left pelvis area  Some tingling in upper lateral thighs (unsure if sciatic or if possible meralgia paresthetica)   Reassuring exam Is doing some stretching for piriformis   Has been wearing weighted vest for exercise- worried this may be worsening things Encouraged to keep walking but w/o the vest  Ref to PT for eval and treat Call back and Er precautions noted in detail today

## 2024-05-29 NOTE — Patient Instructions (Addendum)
 Cut your losartan  hct in 1/2 and take a half pill daily   If your blood pressure gets high- stays over 140 top 90 bottom - let us  know    Stop the weighted vest for now to see if that helps your pelvis/back issues  Keep losing weight   Continue the stretching   I put the referral in for physical therapy  Please let us  know if you don't hear in 1-2 weeks to set that up (mychart message or call or letter)    If symptoms worsen or change let us  know also

## 2024-05-29 NOTE — Assessment & Plan Note (Signed)
 Bilateral upper lateral upper thighs  ? If due to back or possible meralgia paresthetica   Working on weight loss Reassuring exam   Referred to PT   Call back and Er precautions noted in detail today

## 2024-05-29 NOTE — Assessment & Plan Note (Signed)
 Much improved with lifestyle change Disc goals for lipids and reasons to control them Rev last labs with pt Rev low sat fat diet in detail LDL down from 190 to 114

## 2024-05-29 NOTE — Assessment & Plan Note (Signed)
 24 lb weight loss so far with [facility] Metformin  500 mg bid -tolerates  Good lifestyle habits  Encouraged to keep up the good work

## 2024-05-29 NOTE — Progress Notes (Signed)
 Subjective:    Patient ID: Shelia Bowers, female    DOB: Nov 05, 1970, 53 y.o.   MRN: [id number]  HPI  Wt Readings from Last 3 Encounters:  05/29/24 280 lb (127 kg)  05/21/24 284 lb (128.8 kg)  05/20/24 280 lb (127 kg)   46.24 kg/m  Vitals:   05/29/24 0759  BP: 96/64  Pulse: 73  Temp: 98.3 F (36.8 C)  SpO2: 95%    Pt presents for follow up of  HTN Obesity  Hyperlipidemia Low back pain/thigh tingling   HTN bp is stable today  No cp or palpitations or headaches or edema  No side effects to medicines  BP Readings from Last 3 Encounters:  05/29/24 96/64  05/21/24 116/76  05/20/24 124/80    Blood pressure is getting lower and lower with weight loss  Not light headed   Lab Results  Component Value Date   NA 140 03/26/2024   K 4.1 03/26/2024   CO2 23 03/26/2024   GLUCOSE 101 (H) 03/26/2024   BUN 10 03/26/2024   CREATININE 0.92 03/26/2024   CALCIUM 9.2 03/26/2024   GFR 82.54 03/01/2024   EGFR 74 03/26/2024   GFRNONAA >60 10/20/2021   Losartan  hct 50-12.5 mg daily    Hyperlipidemia Lab Results  Component Value Date   CHOL 184 05/01/2024   HDL 50.10 05/01/2024   LDLCALC 114 (H) 05/01/2024   TRIG 96.0 05/01/2024   CHOLHDL 4 05/01/2024   LDL is down from 190 in sept   Obesity Taking metformin  500 mg bid  [facility]  As of last visit had lost 24 lb   Is not eating potatoes like she was  Always eats gluten free  Also avoiding sugar   Exercising  Walks 30 min per day Wears a weighted vest   Some issues with tingling in thighs when she stands  Doing some piriformis stretches   Has a education officer, museum      Patient Active Problem List   Diagnosis Date Noted   Low back pain 05/29/2024   Paresthesia 05/29/2024   Thickened endometrium 03/12/2024   Plantar fasciitis, left 03/01/2024   Laryngopharyngeal reflux (LPR) 02/20/2024   Fatigue 02/20/2024   Amenorrhea 02/20/2024   Current use of proton pump  inhibitor 02/20/2024   Essential hypertension 02/20/2024   Gluten intolerance 02/20/2024   Allergic rhinitis 02/20/2024   Anxiety, generalized 01/24/2018   Asthma, severe persistent (HCC) 01/24/2018   History of colonoscopy 11/26/2013   IBS (irritable bowel syndrome) 11/26/2013   Seborrheic keratosis 05/07/2012   Depressive disorder 02/21/2011   Hypercholesterolemia 02/21/2011   Morbid obesity (HCC) 02/21/2011   Persistent insomnia 02/21/2011   Past Medical History:  Diagnosis Date   Achilles tendonitis    Allergic rhinitis    Allergy    Anxiety    Asthma    Depression    20 years ago   Fatigue    Fatty liver    GERD (gastroesophageal reflux disease)    Gluten intolerance    Hyperlipidemia    Hypertension    Multiple food allergies    Plantar fasciitis    SOBOE (shortness of breath on exertion)    Stomach ulcer    Ulcer    Past Surgical History:  Procedure Laterality Date   CARPAL TUNNEL RELEASE Bilateral    dental implants  06/2010   PLANTAR FASCIA RELEASE Left    calf lengthening left   TUBAL LIGATION  Social History   Tobacco Use   Smoking status: Never    Passive exposure: Never   Smokeless tobacco: Never  Vaping Use   Vaping status: Never Used  Substance Use Topics   Alcohol use: Never   Drug use: Never   Family History  Problem Relation Age of Onset   Hyperlipidemia Mother    Hypertension Mother    Allergic rhinitis Mother    Asthma Mother    Liver disease Mother    Obesity Mother    Cancer Father        prostate   Thyroid disease Father    Stroke Father    Hyperlipidemia Father    Heart disease Father    Anxiety disorder Father    Allergies  Allergen Reactions   Azithromycin Hives   Clarithromycin Hives   Macrolides And Ketolides Hives   Medroxyprogesterone Other (See Comments)    Mono like sx, fatigue, nausea, vomiting Mono like sx, fatigue, nausea, vomiting Mono like sx, fatigue, nausea, vomiting Mono like sx, fatigue,  nausea, vomiting    Rizatriptan Palpitations   Current Outpatient Medications on File Prior to Visit  Medication Sig Dispense Refill   Albuterol -Budesonide (AIRSUPRA ) 90-80 MCG/ACT AERO Inhale 2 Inhalations into the lungs every 4 (four) hours as needed. 11 g 1   budesonide-glycopyrrolate-formoterol (BREZTRI  AEROSPHERE) 160-9-4.8 MCG/ACT AERO inhaler Inhale 2 puffs into the lungs in the morning and at bedtime. 32.1 g 1   EPINEPHrine  (AUVI-Q ) 0.3 mg/0.3 mL IJ SOAJ injection Inject 0.3 mg into the muscle as needed for anaphylaxis. 1 each 1   fexofenadine  (ALLEGRA  ALLERGY) 180 MG tablet Take 1 tablet (180 mg total) by mouth daily. 30 tablet 5   fluticasone  (FLONASE ) 50 MCG/ACT nasal spray Place 2 sprays into both nostrils daily. 1-2 sprays in each nostril during upper airway symptoms 48 g 1   metFORMIN  (GLUCOPHAGE ) 500 MG tablet Take 1 tablet (500 mg total) by mouth 2 (two) times daily with a meal. 60 tablet 1   Multiple Vitamin (MULTIVITAMIN) capsule Take 1 capsule by mouth daily.     omeprazole  (PRILOSEC) 20 MG capsule Take 20 mg by mouth daily.     sertraline (ZOLOFT) 100 MG tablet Take 150 mg by mouth daily.     Spacer/Aero-Holding Chambers DEVI 1 Device by Does not apply route as directed. 1 each 1   Tezepelumab -ekko (TEZSPIRE ) 210 MG/1. SOAJ Inject 210 mg into the skin every 28 (twenty-eight) days. 1.91 mL 11   Current Facility-Administered Medications on File Prior to Visit  Medication Dose Route Frequency Provider Last Rate Last Admin   tezepelumab -ekko (TEZSPIRE ) 210 MG/1. syringe 210 mg  210 mg Subcutaneous Q28 days Kozlow, Eric J, MD   210 mg at 01/16/24 1704    Review of Systems  Constitutional:  Negative for activity change, appetite change, fatigue, fever and unexpected weight change.  HENT:  Negative for congestion, ear pain, rhinorrhea, sinus pressure and sore throat.   Eyes:  Negative for pain, redness and visual disturbance.  Respiratory:  Negative for cough,  shortness of breath and wheezing.   Cardiovascular:  Negative for chest pain and palpitations.  Gastrointestinal:  Negative for abdominal pain, blood in stool, constipation and diarrhea.  Endocrine: Negative for polydipsia and polyuria.  Genitourinary:  Negative for dysuria, frequency and urgency.  Musculoskeletal:  Positive for back pain. Negative for arthralgias and myalgias.  Skin:  Negative for pallor and rash.  Allergic/Immunologic: Negative for environmental allergies.  Neurological:  Negative for dizziness, syncope and  headaches.       Tingling of upper outer thighs when standing   Hematological:  Negative for adenopathy. Does not bruise/bleed easily.  Psychiatric/Behavioral:  Negative for decreased concentration and dysphoric mood. The patient is not nervous/anxious.        Objective:   Physical Exam Constitutional:      General: She is not in acute distress.    Appearance: Normal appearance. She is well-developed. She is obese. She is not ill-appearing or diaphoretic.  HENT:     Head: Normocephalic and atraumatic.  Eyes:     Conjunctiva/sclera: Conjunctivae normal.     Pupils: Pupils are equal, round, and reactive to light.  Neck:     Thyroid: No thyromegaly.     Vascular: No carotid bruit or JVD.  Cardiovascular:     Rate and Rhythm: Normal rate and regular rhythm.     Heart sounds: Normal heart sounds.     No gallop.  Pulmonary:     Effort: Pulmonary effort is normal. No respiratory distress.     Breath sounds: Normal breath sounds. No wheezing or rales.  Abdominal:     General: There is no distension or abdominal bruit.     Palpations: Abdomen is soft.  Musculoskeletal:     Cervical back: Normal range of motion and neck supple.     Right lower leg: No edema.     Left lower leg: No edema.     Comments: Normal rom hips Normal rom of LS  No bony tenderness     Lymphadenopathy:     Cervical: No cervical adenopathy.  Skin:    General: Skin is warm and dry.      Coloration: Skin is not pale.     Findings: No rash.  Neurological:     Mental Status: She is alert.     Sensory: No sensory deficit.     Motor: No weakness.     Coordination: Coordination normal.     Gait: Gait normal.     Deep Tendon Reflexes: Reflexes are normal and symmetric. Reflexes normal.  Psychiatric:        Mood and Affect: Mood normal.           Assessment & Plan:   Problem List Items Addressed This Visit       Cardiovascular and Mediastinum   Essential hypertension - Primary   Blood pressure is lower with weight loss BP: 96/64  Great lifestyle habits  Plan to cut losartan  hct in 1/2  for 25-6.25 total daily Pt will check at home and also at the [facility] Encouraged to keep up the great work       Relevant Medications   losartan -hydrochlorothiazide (HYZAAR) 50-12.5 MG tablet     Other   Paresthesia   Bilateral upper lateral upper thighs  ? If due to back or possible meralgia paresthetica   Working on weight loss Reassuring exam   Referred to PT   Call back and Er precautions noted in detail today        Morbid obesity (HCC)   24 lb weight loss so far with [facility] Metformin  500 mg bid -tolerates  Good lifestyle habits  Encouraged to keep up the good work       Low back pain   Midline low back  Left pelvis area  Some tingling in upper lateral thighs (unsure if sciatic or if possible meralgia paresthetica)   Reassuring exam Is doing some stretching for  piriformis   Has been wearing weighted vest for exercise- worried this may be worsening things Encouraged to keep walking but w/o the vest  Ref to PT for eval and treat Call back and Er precautions noted in detail today        Relevant Orders   Ambulatory referral to Physical Therapy   Hypercholesterolemia   Much improved with lifestyle change Disc goals for lipids and reasons to control them Rev last labs with pt Rev low sat fat diet in  detail LDL down from 190 to 114      Relevant Medications   losartan -hydrochlorothiazide (HYZAAR) 50-12.5 MG tablet

## 2024-05-30 ENCOUNTER — Ambulatory Visit: Admitting: Family Medicine

## 2024-06-03 ENCOUNTER — Ambulatory Visit (INDEPENDENT_AMBULATORY_CARE_PROVIDER_SITE_OTHER): Payer: Self-pay | Admitting: Physician Assistant

## 2024-06-03 ENCOUNTER — Encounter (INDEPENDENT_AMBULATORY_CARE_PROVIDER_SITE_OTHER): Payer: Self-pay | Admitting: Physician Assistant

## 2024-06-03 VITALS — BP 128/75 | HR 59 | Temp 97.7°F | Ht 65.5 in | Wt 276.0 lb

## 2024-06-03 DIAGNOSIS — K9041 Non-celiac gluten sensitivity: Secondary | ICD-10-CM | POA: Diagnosis not present

## 2024-06-03 DIAGNOSIS — R202 Paresthesia of skin: Secondary | ICD-10-CM

## 2024-06-03 DIAGNOSIS — Z6841 Body Mass Index (BMI) 40.0 and over, adult: Secondary | ICD-10-CM

## 2024-06-03 DIAGNOSIS — I10 Essential (primary) hypertension: Secondary | ICD-10-CM

## 2024-06-03 DIAGNOSIS — R7303 Prediabetes: Secondary | ICD-10-CM | POA: Diagnosis not present

## 2024-06-03 MED ORDER — METFORMIN HCL 500 MG PO TABS: 500.0000 mg | ORAL_TABLET | Freq: Two times a day (BID) | ORAL | 1 refills | Status: DC

## 2024-06-03 NOTE — Progress Notes (Signed)
 SUBJECTIVE: Discussed the use of AI scribe software for clinical note transcription with the patient, who gave verbal consent to proceed.  Chief Complaint: Obesity  Interim History: She is down 4 lbs since her last visit.  Down 28 lbs overall TBW loss of 9.2%  Jermika is here to discuss her progress with her obesity treatment plan. She is on the Category 3 Plan and states she is following her eating plan approximately 90-95 % of the time. She states she is exercising walking 30  minutes 7 times per week. VERLAINE EMBRY [name] is a 53 year old female who presents for follow-up of her obesity treatment plan.  She has lost four pounds since her last visit, totaling a weight loss of twenty-eight pounds since starting the program. She adheres to a category three plan 90-95% of the time, focusing on calorie tracking, consuming more whole foods, ensuring adequate protein intake, and maintaining proper hydration. She does not skip meals and sleeps seven to nine hours per night. She engages in thirty minutes of walking daily.  She has a history of prediabetes and is currently taking metformin  500 mg twice daily. Adjusting the second dose to dinner instead of lunch helps manage hunger more effectively. She experiences no gastrointestinal side effects from metformin .  Her hypertension is managed with losartan -hydrochlorothiazide 50/12.5 mg, half a tablet daily. Her blood pressure medication dosage was recently reduced.  She has a history of hyperlipidemia and gluten intolerance, having adapted to dietary changes over the past ten years due to gluten intolerance.  She experiences episodes of tingling and numbness in her legs, which she associates with using a weighted vest.  OBJECTIVE: Visit Diagnoses: Problem List Items Addressed This Visit     Morbid obesity (HCC)   Relevant Medications   metFORMIN  (GLUCOPHAGE ) 500 MG tablet   Essential hypertension - Primary   Gluten intolerance    Paresthesia   Other Visit Diagnoses       Prediabetes         BMI 45.0-49.9, adult (HCC) Current BMI 45.2       Relevant Medications   metFORMIN  (GLUCOPHAGE ) 500 MG tablet      Obesity She has lost 28 pounds since starting the program, with a total weight loss of 9.2% of her body weight since September 30th. She is following a category three plan 90-95% of the time, tracking calories, eating more whole foods, and getting adequate protein and water. She is walking for 30 minutes seven days a week. She is managing portion control and making smart food choices, especially during holidays and has not felt excessively hungry or craving foods on Cat. 3 plan.   She is not experiencing excessive hunger or cravings. - Continue current obesity treatment plan. - Encouraged continued Cat 3 plan -Discussed using same holiday strategies that she used at Thanksgiving for upcoming holidays. - Encouraged continuation of walking regimen. - Discussed potential future incorporation of strength training exercises if she can tolerate .  Prediabetes She is on metformin  500 mg twice daily for prediabetes. She reports increased hunger with the second dose of metformin  if taken at lunch, but this is managed by taking the second dose with dinner. No gastrointestinal side effects reported. The goal is to reduce A1c and insulin  levels to alleviate stress on the pancreas and improve metabolic efficiency. Lab Results  Component Value Date   HGBA1C 5.9 (H) 03/26/2024   Lab Results  Component Value Date   LDLCALC 114 (H) 05/01/2024  CREATININE 0.92 03/26/2024   INSULIN   Date Value Ref Range Status  03/26/2024 32.2 (H) 2.6 - 24.9 uIU/mL Final  ]Continue working on nutrition plan to decrease simple carbohydrates, increase lean proteins and exercise to promote weight loss, improve glycemic control and prevent progression to Type 2 diabetes.   - Continue/refill metformin  500 mg twice daily, with the second dose  taken with dinner. - Will order follow-up labs to assess A1c and insulin  levels at next appointment. Meds ordered this encounter  Medications   metFORMIN  (GLUCOPHAGE ) 500 MG tablet    Sig: Take 1 tablet (500 mg total) by mouth 2 (two) times daily with a meal.    Dispense:  60 tablet    Refill:  1    Essential hypertension She is on losartan -hydrochlorothiazide 50/12.5 mg, half a tablet daily. Her blood pressure is well-controlled, and she has had her medication halved due to weight loss. There is potential for eventual discontinuation of blood pressure medication with continued weight loss. BP Readings from Last 3 Encounters:  06/03/24 128/75  05/29/24 96/64  05/21/24 116/76   Continue to work on nutrition plan to promote weight loss and improve BP control.  - Continue losartan -hydrochlorothiazide 50/12.5 mg, half a tablet daily. - Monitor blood pressure and will consider medication adjustment based on future weight loss.  Gluten intolerance She has adapted well to gluten intolerance over the past ten years and is managing her diet effectively. - Continue current dietary management for gluten intolerance.  Paresthesias She developed some paresthesias in her anterior thigh areas when added weighted vest to walking and saw her PCP.  She is going to start PT for this and will monitor for persistent/recurrence.   Vitals Temp: 97.7 F (36.5 C) BP: 128/75 Pulse Rate: (!) 59 SpO2: 96 %   Anthropometric Measurements Height: 5' 5.5 (1.664 m) Weight: 276 lb (125.2 kg) BMI (Calculated): 45.21 Weight at Last Visit: 280 lb Weight Lost Since Last Visit: 4 lb Weight Gained Since Last Visit: 0 Starting Weight: 304 lb Total Weight Loss (lbs): 28 lb (12.7 kg) Peak Weight: 312 lb   Body Composition  Body Fat %: 54.3 % Fat Mass (lbs): 150 lbs Muscle Mass (lbs): 120 lbs Total Body Water (lbs): 94.6 lbs Visceral Fat Rating : 20   Other Clinical Data Fasting: Yes Labs:  No Today's Visit #: 6 Starting Date: 03/26/24     ASSESSMENT AND PLAN:  Diet: Lachanda is currently in the action stage of change. As such, her goal is to continue with weight loss efforts. She has agreed to Category 3 Plan.  Exercise: Kayley has been instructed to work up to a goal of 150 minutes of combined cardio and strengthening exercise per week for weight loss and overall health benefits.   Behavior Modification:  We discussed the following Behavioral Modification Strategies today: increasing lean protein intake, decreasing simple carbohydrates, increasing vegetables, increase H2O intake, increase high fiber foods, meal planning and cooking strategies, holiday eating strategies, avoiding temptations, and planning for success. We discussed various medication options to help Aniko with her weight loss efforts and we both agreed to continue metformin  for primary indication of prediabetes and insulin  resistance- off label use.  Return in about 4 weeks (around 07/01/2024) for Fasting Lab.SABRA She was informed of the importance of frequent follow up visits to maximize her success with intensive lifestyle modifications for her multiple health conditions.  Attestation Statements:   Reviewed by clinician on day of visit: allergies, medications, problem list, medical history,  surgical history, family history, social history, and previous encounter notes.   Time spent on visit including pre-visit chart review and post-visit care and charting was 27 minutes.    Balen Woolum, PA-C

## 2024-06-07 ENCOUNTER — Ambulatory Visit

## 2024-06-07 DIAGNOSIS — J309 Allergic rhinitis, unspecified: Secondary | ICD-10-CM | POA: Diagnosis not present

## 2024-06-17 ENCOUNTER — Ambulatory Visit: Admitting: Family Medicine

## 2024-06-18 ENCOUNTER — Other Ambulatory Visit: Payer: Self-pay

## 2024-06-18 ENCOUNTER — Encounter: Payer: Self-pay | Admitting: Allergy and Immunology

## 2024-06-18 ENCOUNTER — Ambulatory Visit (INDEPENDENT_AMBULATORY_CARE_PROVIDER_SITE_OTHER): Admitting: Allergy and Immunology

## 2024-06-18 VITALS — BP 144/74 | HR 62 | Temp 97.7°F | Resp 20

## 2024-06-18 DIAGNOSIS — J4550 Severe persistent asthma, uncomplicated: Secondary | ICD-10-CM | POA: Diagnosis not present

## 2024-06-18 DIAGNOSIS — J301 Allergic rhinitis due to pollen: Secondary | ICD-10-CM

## 2024-06-18 DIAGNOSIS — J3089 Other allergic rhinitis: Secondary | ICD-10-CM

## 2024-06-18 DIAGNOSIS — K219 Gastro-esophageal reflux disease without esophagitis: Secondary | ICD-10-CM

## 2024-06-18 MED ORDER — FLUTICASONE PROPIONATE 50 MCG/ACT NA SUSP: 2.0000 | Freq: Every day | NASAL | 1 refills | Status: AC

## 2024-06-18 MED ORDER — AIRSUPRA 90-80 MCG/ACT IN AERO: 2.0000 | INHALATION_SPRAY | Freq: Four times a day (QID) | RESPIRATORY_TRACT | 1 refills | Status: AC | PRN

## 2024-06-18 MED ORDER — BREZTRI AEROSPHERE 160-9-4.8 MCG/ACT IN AERO: 2.0000 | INHALATION_SPRAY | Freq: Every day | RESPIRATORY_TRACT | 1 refills | Status: AC

## 2024-06-18 MED ORDER — OMEPRAZOLE 20 MG PO CPDR: 20.0000 mg | DELAYED_RELEASE_CAPSULE | Freq: Every day | ORAL | 2 refills | Status: AC

## 2024-06-18 MED ORDER — NYSTATIN 100000 UNIT/ML MT SUSP: 5.0000 mL | Freq: Four times a day (QID) | OROMUCOSAL | 2 refills | Status: AC

## 2024-06-18 NOTE — Patient Instructions (Addendum)
" °  1.  Treat and prevent inflammation:  A.  DECREASE Breztri  - 2 inhalations 3-7 times per week B.  Flonase  - 1-2 sprays each nostril 3-7 times per week  C.  Tezepelumab  injections every 4 weeks D.  DISCONTINUE immunotherapy  2. Treat reflux induced inflammation of airway:   A. Omeprazole  40 mg - 1 tablet 1-2 times per day  B. Minimize caffeine and chocolate   C. Replace throat clearing with swallowing/ drinking maneuver  3. If needed:   A. Antihistamine  B. Nasal saline  C. AIRSUPRA  - 2 inhalations every 6 hours  D. Nystatin  - 5 mls swish, gargle swallow after Breztri  use  4. Return to clinic 6 months or earlier if problem  5. Influenza = Tamiflu. Covid = Paxlovid  "

## 2024-06-18 NOTE — Progress Notes (Signed)
 "  [place] [facility] [place] [facility] [place] [facility] [place] [facility] [place]   Follow-up Note  Referring Provider: Derick Leita POUR, MD Primary Provider: Randeen Laine LABOR, MD Date of Office Visit: 06/18/2024  Subjective:   Shelia Bowers (DOB: [birth year]) is a 53 y.o. female who [facility] in re-evaluation of the following:  HPI: Shelia Bowers returns to this clinic in evaluation of asthma, allergic rhinoconjunctivitis, LPR.  I last saw her in this clinic 15 February 2024.  She has had a very good interval of time regarding her asthma since her last visit without the need for systemic steroid or the use of a short acting bronchodilator or limitation on ability to exercise while using her tezepelumab  injections and tapering down her Breztri  to just 1 time per day.  She has had very little problems with her upper airway while using Flonase  just a few times per week and she has not required an antibiotic to treat an episode of sinusitis.  She has completed 5 years of immunotherapy [facility].  Her reflux and her throat issues under very good control while using her omeprazole  twice a day.  She attempted once a day but had problems with reflux.  She has had no problems with thrush while using her Breztri  just 1 time per day.  She has obtained the flu vaccine and the RSV vaccine.  Allergies as of 06/18/2024       Reactions   Azithromycin Hives   Clarithromycin Hives   Macrolides And Ketolides Hives   Medroxyprogesterone Other (See Comments)   Mono like sx, fatigue, nausea, vomiting Mono like sx, fatigue, nausea, vomiting Mono like sx, fatigue, nausea, vomiting Mono like sx, fatigue, nausea, vomiting   Rizatriptan Palpitations        Medication List    Airsupra  90-80 MCG/ACT Aero Generic drug: Albuterol -Budesonide Inhale 2 Inhalations into the lungs every 4 (four) hours as needed.   Breztri  Aerosphere 160-9-4.8 MCG/ACT Aero inhaler Generic drug:  budesonide-glycopyrrolate-formoterol Inhale 2 puffs into the lungs in the morning and at bedtime.   EPINEPHrine  0.3 mg/0.3 mL Soaj injection Commonly known as: Auvi-Q  Inject 0.3 mg into the muscle as needed for anaphylaxis.   fexofenadine  180 MG tablet Commonly known as: Allegra  Allergy Take 1 tablet (180 mg total) by mouth daily.   fluticasone  50 MCG/ACT nasal spray Commonly known as: FLONASE  Place 2 sprays into both nostrils daily. 1-2 sprays in each nostril during upper airway symptoms   losartan -hydrochlorothiazide 50-12.5 MG tablet Commonly known as: HYZAAR Take 0.5 tablets by mouth daily.   metFORMIN  500 MG tablet Commonly known as: GLUCOPHAGE  Take 1 tablet (500 mg total) by mouth 2 (two) times daily with a meal.   multivitamin capsule Take 1 capsule by mouth daily.   nystatin  100000 UNIT/ML suspension Commonly known as: MYCOSTATIN  Take 5 mLs by mouth 4 (four) times daily.   omeprazole  20 MG capsule Commonly known as: PRILOSEC Take 20 mg by mouth daily.   sertraline  100 MG tablet Commonly known as: ZOLOFT  Take 150 mg by mouth daily.   Spacer/Aero-Holding [employer] 1 Device by Does not apply route as directed.   Tezspire  210 MG/1. Soaj Generic drug: Tezepelumab -ekko Inject 210 mg into the skin every 28 (twenty-eight) days.    Past Medical History:  Diagnosis Date   Achilles tendonitis    Allergic rhinitis    Allergy    Anxiety    Asthma    Depression  20 years ago   Fatigue    Fatty liver    GERD (gastroesophageal reflux disease)    Gluten intolerance    Hyperlipidemia    Hypertension    Multiple food allergies    Plantar fasciitis    SOBOE (shortness of breath on exertion)    Stomach ulcer    Ulcer     Past Surgical History:  Procedure Laterality Date   CARPAL TUNNEL RELEASE Bilateral    dental implants  06/2010   PLANTAR FASCIA RELEASE Left    calf lengthening left   TUBAL LIGATION      Review of systems negative except as  noted in HPI / PMHx or noted below:  Review of Systems  Constitutional: Negative.   HENT: Negative.    Eyes: Negative.   Respiratory: Negative.    Cardiovascular: Negative.   Gastrointestinal: Negative.   Genitourinary: Negative.   Musculoskeletal: Negative.   Skin: Negative.   Neurological: Negative.   Endo/Heme/Allergies: Negative.   Psychiatric/Behavioral: Negative.       Objective:   Vitals:   06/18/24 0903  BP: (!) 144/74  Pulse: 62  Resp: 20  Temp: 97.7 F (36.5 C)  SpO2: 98%          Physical Exam Constitutional:      Appearance: She is not diaphoretic.  HENT:     Head: Normocephalic.     Right Ear: Tympanic membrane, ear canal and external ear normal.     Left Ear: Tympanic membrane, ear canal and external ear normal.     Nose: Nose normal. No mucosal edema or rhinorrhea.     Mouth/Throat:     Pharynx: Uvula midline. No oropharyngeal exudate.  Eyes:     Conjunctiva/sclera: Conjunctivae normal.  Neck:     Thyroid: No thyromegaly.     Trachea: Trachea normal. No tracheal tenderness or tracheal deviation.  Cardiovascular:     Rate and Rhythm: Normal rate and regular rhythm.     Heart sounds: Normal heart sounds, S1 normal and S2 normal. No murmur heard. Pulmonary:     Effort: No respiratory distress.     Breath sounds: Normal breath sounds. No stridor. No wheezing or rales.  Lymphadenopathy:     Head:     Right side of head: No tonsillar adenopathy.     Left side of head: No tonsillar adenopathy.     Cervical: No cervical adenopathy.  Skin:    Findings: No erythema or rash.     Nails: There is no clubbing.  Neurological:     Mental Status: She is alert.     Diagnostics: Spirometry was performed and demonstrated an FEV1 of 2.79 at 99 % of predicted.  Assessment and Plan:   1. Asthma, severe persistent, well-controlled (HCC)   2. Perennial allergic rhinitis   3. Seasonal allergic rhinitis due to pollen   4. LPRD (laryngopharyngeal reflux  disease)    1.  Treat and prevent inflammation:  A.  DECREASE Breztri  - 2 inhalations 3-7 times per week B.  Flonase  - 1-2 sprays each nostril 3-7 times per week  C.  Tezepelumab  injections every 4 weeks D.  DISCONTINUE immunotherapy  2. Treat reflux induced inflammation of airway:   A. Omeprazole  40 mg - 1 tablet 1-2 times per day  B. Minimize caffeine and chocolate   C. Replace throat clearing with swallowing/ drinking maneuver  3. If needed:   A. Antihistamine  B. Nasal saline  C. AIRSUPRA  - 2 inhalations every 6  hours  D. Nystatin  - 5 mls swish, gargle swallow after Breztri  use  4. Return to clinic 6 months or earlier if problem  5. Influenza = Tamiflu. Covid = Paxlovid   Shelia Bowers is doing wonderful and I think there is an opportunity to consolidate her medical therapy.  Will have her decrease her Breztri  to just a few times per week and if she continues to do well she can discontinue this agent.  She has undergone 5 years of immunotherapy and there is no need for any additional immunotherapy at this point.  She will continue on anti-TSLP antibody as noted above.  She will continue to treat her reflux with omeprazole  twice a day.  Will see her back in this clinic in 6 months or earlier if there is a problem.  Camellia Denis, MD Allergy / Immunology [facility] "

## 2024-06-19 ENCOUNTER — Other Ambulatory Visit: Payer: Self-pay

## 2024-06-19 ENCOUNTER — Other Ambulatory Visit: Payer: Self-pay | Admitting: Pharmacy Technician

## 2024-06-19 ENCOUNTER — Encounter (INDEPENDENT_AMBULATORY_CARE_PROVIDER_SITE_OTHER): Payer: Self-pay

## 2024-06-19 NOTE — Progress Notes (Signed)
 Specialty Pharmacy Refill Coordination Note  Shelia Bowers is a 53 y.o. female contacted today regarding refills of specialty medication(s) Tezepelumab -ekko (Tezspire )   Patient requested Delivery   Delivery date: 06/25/24   Verified address: [address] [place], [place] [address]   Medication will be filled on: 06/24/24

## 2024-06-21 ENCOUNTER — Ambulatory Visit (INDEPENDENT_AMBULATORY_CARE_PROVIDER_SITE_OTHER): Admitting: Family Medicine

## 2024-06-21 ENCOUNTER — Encounter: Payer: Self-pay | Admitting: Family Medicine

## 2024-06-21 VITALS — BP 126/74 | HR 74 | Temp 98.0°F | Ht 65.5 in | Wt 273.1 lb

## 2024-06-21 DIAGNOSIS — L304 Erythema intertrigo: Secondary | ICD-10-CM | POA: Diagnosis not present

## 2024-06-21 DIAGNOSIS — I10 Essential (primary) hypertension: Secondary | ICD-10-CM | POA: Diagnosis not present

## 2024-06-21 DIAGNOSIS — K219 Gastro-esophageal reflux disease without esophagitis: Secondary | ICD-10-CM | POA: Diagnosis not present

## 2024-06-21 DIAGNOSIS — E7800 Pure hypercholesterolemia, unspecified: Secondary | ICD-10-CM | POA: Diagnosis not present

## 2024-06-21 DIAGNOSIS — R7303 Prediabetes: Secondary | ICD-10-CM | POA: Diagnosis not present

## 2024-06-21 DIAGNOSIS — F32A Depression, unspecified: Secondary | ICD-10-CM

## 2024-06-21 MED ORDER — NYSTATIN 100000 UNIT/GM EX CREA: 1.0000 | TOPICAL_CREAM | Freq: Two times a day (BID) | CUTANEOUS | 2 refills | Status: AC

## 2024-06-21 MED ORDER — SERTRALINE HCL 100 MG PO TABS: 150.0000 mg | ORAL_TABLET | Freq: Every day | ORAL | 2 refills | Status: AC

## 2024-06-21 NOTE — Assessment & Plan Note (Signed)
 bp in fair control at this time  BP Readings from Last 1 Encounters:  06/21/24 126/74   No changes needed Most recent labs reviewed  Disc lifstyle change with low sodium diet and exercise  Continues losartan -hct 50-12.5 mg 1/2 pill daily -needed less medication with weight loss

## 2024-06-21 NOTE — Assessment & Plan Note (Signed)
 Doing well with sertraline  150 mg daily   Thinks with decreased stress she would like to titrate down in the next year   Refilled  Encouraged good self care Will reach out when ready to reduce dose and we can work in 25 mg intervals

## 2024-06-21 NOTE — Assessment & Plan Note (Signed)
 Continues omeprazole  20 mg daily  Continues weight loss effort

## 2024-06-21 NOTE — Assessment & Plan Note (Signed)
 Intermittent fungal intertrigo under pannus  Nystatin  cream prn- refilled  Not active today   Encouraged to keep area clean and very dry  Continued weight loss should help

## 2024-06-21 NOTE — Progress Notes (Signed)
 "  Subjective:    Patient ID: Shelia Bowers, female    DOB: 01/07/1971, 53 y.o.   MRN: [id number]  HPI  Wt Readings from Last 3 Encounters:  06/21/24 273 lb 2 oz (123.9 kg)  06/03/24 276 lb (125.2 kg)  05/29/24 280 lb (127 kg)   44.76 kg/m  Vitals:   06/21/24 0757  BP: 126/74  Pulse: 74  Temp: 98 F (36.7 C)  SpO2: 96%    Pt presents for follow up of chronic medical problems   HTN Acid reflux  Mood  Prediabetes Occational intertrigo in groin     HTN bp is stable today  No cp or palpitations or headaches or edema  No side effects to medicines  BP Readings from Last 3 Encounters:  06/21/24 126/74  06/18/24 (!) 144/74  06/03/24 128/75    Losartan  -hct 50-12.5 mg  half pill daily- about a month of being on 1/2 pill  No problems   Doing great with the healthy weight clinic   Cut out sugar entirely  Has become used to it    Lab Results  Component Value Date   NA 140 03/26/2024   K 4.1 03/26/2024   CO2 23 03/26/2024   GLUCOSE 101 (H) 03/26/2024   BUN 10 03/26/2024   CREATININE 0.92 03/26/2024   CALCIUM 9.2 03/26/2024   GFR 82.54 03/01/2024   EGFR 74 03/26/2024   GFRNONAA >60 10/20/2021    Prediabetes Lab Results  Component Value Date   HGBA1C 5.9 (H) 03/26/2024    Metformin  500 mg bid  Going to the healthy weight [facility] wellness clinic    Mood  Sertraline  150 mg daily  Doing well overall     06/21/2024    8:11 AM 05/29/2024    8:06 AM 05/21/2024   10:22 AM 03/01/2024    8:07 AM 02/20/2024   12:16 PM  Depression screen PHQ 2/9  Decreased Interest 0 0 0 0 0  Down, Depressed, Hopeless 0 0 0 0 0  PHQ - 2 Score 0 0 0 0 0  Altered sleeping 0 0  0 0  Tired, decreased energy 1 1  1 2   Change in appetite 0 0  0 1  Feeling bad or failure about yourself  0 0  0 0  Trouble concentrating 0 0  0 0  Moving slowly or fidgety/restless 0 0  0 0  Suicidal thoughts 0 0  0 0  PHQ-9 Score 1 1  1  3    Difficult doing work/chores Not difficult at all Not  difficult at all  Not difficult at all Not difficult at all     Data saved with a previous flowsheet row definition   Interested in tapering down - will have family members moving soon   Exercise  Education officer, museum  Walking dog also   Lipids Lab Results  Component Value Date   CHOL 184 05/01/2024   HDL 50.10 05/01/2024   LDLCALC 114 (H) 05/01/2024   TRIG 96.0 05/01/2024   CHOLHDL 4 05/01/2024   Improved with better diet   Patient Active Problem List   Diagnosis Date Noted   Prediabetes 06/21/2024   Intertrigo 06/21/2024   Low back pain 05/29/2024   Paresthesia 05/29/2024   Thickened endometrium 03/12/2024   Plantar fasciitis, left 03/01/2024   Laryngopharyngeal reflux (LPR) 02/20/2024   Fatigue 02/20/2024   Amenorrhea 02/20/2024   Current use of proton pump inhibitor 02/20/2024   Essential hypertension 02/20/2024   Gluten  intolerance 02/20/2024   Allergic rhinitis 02/20/2024   Anxiety, generalized 01/24/2018   Asthma, severe persistent (HCC) 01/24/2018   History of colonoscopy 11/26/2013   IBS (irritable bowel syndrome) 11/26/2013   Seborrheic keratosis 05/07/2012   Depressive disorder 02/21/2011   Hypercholesterolemia 02/21/2011   Morbid obesity (HCC) 02/21/2011   Persistent insomnia 02/21/2011   Past Medical History:  Diagnosis Date   Achilles tendonitis    Allergic rhinitis    Allergy    Anxiety    Asthma    Depression    20 years ago   Fatigue    Fatty liver    GERD (gastroesophageal reflux disease)    Gluten intolerance    Hyperlipidemia    Hypertension    Multiple food allergies    Plantar fasciitis    SOBOE (shortness of breath on exertion)    Stomach ulcer    Ulcer    Past Surgical History:  Procedure Laterality Date   CARPAL TUNNEL RELEASE Bilateral    dental implants  06/2010   PLANTAR FASCIA RELEASE Left    calf lengthening left   TUBAL LIGATION     Social History[1] Family History  Problem Relation Age of Onset    Hyperlipidemia Mother    Hypertension Mother    Allergic rhinitis Mother    Asthma Mother    Liver disease Mother    Obesity Mother    Cancer Father        prostate   Thyroid disease Father    Stroke Father    Hyperlipidemia Father    Heart disease Father    Anxiety disorder Father    Allergies[2] Medications Ordered Prior to Encounter[3]  Review of Systems  Constitutional:  Negative for activity change, appetite change, fatigue, fever and unexpected weight change.  HENT:  Negative for congestion, ear pain, rhinorrhea, sinus pressure and sore throat.   Eyes:  Negative for pain, redness and visual disturbance.  Respiratory:  Negative for cough, shortness of breath and wheezing.   Cardiovascular:  Negative for chest pain and palpitations.  Gastrointestinal:  Negative for abdominal pain, blood in stool, constipation and diarrhea.  Endocrine: Negative for polydipsia and polyuria.  Genitourinary:  Negative for dysuria, frequency and urgency.  Musculoskeletal:  Negative for arthralgias, back pain and myalgias.  Skin:  Negative for pallor and rash.  Allergic/Immunologic: Negative for environmental allergies.  Neurological:  Negative for dizziness, syncope and headaches.  Hematological:  Negative for adenopathy. Does not bruise/bleed easily.  Psychiatric/Behavioral:  Negative for decreased concentration and dysphoric mood. The patient is not nervous/anxious.        Objective:   Physical Exam Constitutional:      General: She is not in acute distress.    Appearance: Normal appearance. She is well-developed. She is obese.  HENT:     Head: Normocephalic and atraumatic.  Eyes:     Conjunctiva/sclera: Conjunctivae normal.     Pupils: Pupils are equal, round, and reactive to light.  Neck:     Thyroid: No thyromegaly.     Vascular: No carotid bruit or JVD.  Cardiovascular:     Rate and Rhythm: Normal rate and regular rhythm.     Heart sounds: Normal heart sounds.     No gallop.   Pulmonary:     Effort: Pulmonary effort is normal. No respiratory distress.     Breath sounds: Normal breath sounds. No wheezing or rales.  Abdominal:     General: There is no distension or abdominal bruit.  Palpations: Abdomen is soft.  Musculoskeletal:     Cervical back: Normal range of motion and neck supple.     Right lower leg: No edema.     Left lower leg: No edema.  Lymphadenopathy:     Cervical: No cervical adenopathy.  Skin:    General: Skin is warm and dry.     Coloration: Skin is not pale.     Findings: No rash.  Neurological:     Mental Status: She is alert.     Coordination: Coordination normal.     Deep Tendon Reflexes: Reflexes are normal and symmetric. Reflexes normal.  Psychiatric:        Mood and Affect: Mood normal.           Assessment & Plan:   Problem List Items Addressed This Visit       Cardiovascular and Mediastinum   Essential hypertension - Primary   bp in fair control at this time  BP Readings from Last 1 Encounters:  06/21/24 126/74   No changes needed Most recent labs reviewed  Disc lifstyle change with low sodium diet and exercise  Continues losartan -hct 50-12.5 mg 1/2 pill daily -needed less medication with weight loss         Respiratory   Laryngopharyngeal reflux (LPR)   Continues omeprazole  20 mg daily  Continues weight loss effort         Musculoskeletal and Integument   Intertrigo   Intermittent fungal intertrigo under pannus  Nystatin  cream prn- refilled  Not active today   Encouraged to keep area clean and very dry  Continued weight loss should help        Other   Prediabetes   Under care of healthy weight clinic Lab Results  Component Value Date   HGBA1C 5.9 (H) 03/26/2024    Metformin  500 mg bid Tolerating well  Doing well with weight loss and low glycemic diet       Hypercholesterolemia   Much improved with lifestyle change Disc goals for lipids and reasons to control them Rev last labs  with pt Rev low sat fat diet in detail LDL down from 190 to 114      Depressive disorder   Doing well with sertraline  150 mg daily   Thinks with decreased stress she would like to titrate down in the next year   Refilled  Encouraged good self care Will reach out when ready to reduce dose and we can work in 25 mg intervals       Relevant Medications   sertraline  (ZOLOFT ) 100 MG tablet      [1]  Social History Tobacco Use   Smoking status: Never    Passive exposure: Never   Smokeless tobacco: Never  Vaping Use   Vaping status: Never Used  Substance Use Topics   Alcohol use: Never   Drug use: Never  [2]  Allergies Allergen Reactions   Azithromycin Hives   Clarithromycin Hives   Macrolides And Ketolides Hives   Medroxyprogesterone Other (See Comments)    Mono like sx, fatigue, nausea, vomiting Mono like sx, fatigue, nausea, vomiting Mono like sx, fatigue, nausea, vomiting Mono like sx, fatigue, nausea, vomiting    Rizatriptan Palpitations  [3]  Current Outpatient Medications on File Prior to Visit  Medication Sig Dispense Refill   Albuterol -Budesonide (AIRSUPRA ) 90-80 MCG/ACT AERO Inhale 2 Inhalations into the lungs every 6 (six) hours as needed. 10.7 g 1   budesonide-glycopyrrolate-formoterol (BREZTRI  AEROSPHERE) 160-9-4.8 MCG/ACT AERO inhaler Inhale 2  puffs into the lungs daily. 32.1 g 1   EPINEPHrine  (AUVI-Q ) 0.3 mg/0.3 mL IJ SOAJ injection Inject 0.3 mg into the muscle as needed for anaphylaxis. 1 each 1   fexofenadine  (ALLEGRA  ALLERGY) 180 MG tablet Take 1 tablet (180 mg total) by mouth daily. 30 tablet 5   fluticasone  (FLONASE ) 50 MCG/ACT nasal spray Place 2 sprays into both nostrils daily. 1-2 sprays in each nostril during upper airway symptoms 48 g 1   losartan -hydrochlorothiazide (HYZAAR) 50-12.5 MG tablet Take 0.5 tablets by mouth daily. 45 tablet 3   metFORMIN  (GLUCOPHAGE ) 500 MG tablet Take 1 tablet (500 mg total) by mouth 2 (two) times daily with a meal.  60 tablet 1   Multiple Vitamin (MULTIVITAMIN) capsule Take 1 capsule by mouth daily.     nystatin  (MYCOSTATIN ) 100000 UNIT/ML suspension Take 5 mLs (500,000 Units total) by mouth 4 (four) times daily. 60 mL 2   omeprazole  (PRILOSEC) 20 MG capsule Take 1 capsule (20 mg total) by mouth daily. 60 capsule 2   Spacer/Aero-Holding [employer] 1 Device by Does not apply route as directed. 1 each 1   Tezepelumab -ekko (TEZSPIRE ) 210 MG/1. SOAJ Inject 210 mg into the skin every 28 (twenty-eight) days. 1.91 mL 11   Current Facility-Administered Medications on File Prior to Visit  Medication Dose Route Frequency Provider Last Rate Last Admin   tezepelumab -ekko (TEZSPIRE ) 210 MG/1. syringe 210 mg  210 mg Subcutaneous Q28 days Kozlow, Eric J, MD   210 mg at 01/16/24 1704   "

## 2024-06-21 NOTE — Patient Instructions (Addendum)
 Keep up the good work with diet and exercise    I sent in sertraline  and nystatin    When ready to titrate down on sertraline  let me know

## 2024-06-21 NOTE — Assessment & Plan Note (Signed)
 Much improved with lifestyle change Disc goals for lipids and reasons to control them Rev last labs with pt Rev low sat fat diet in detail LDL down from 190 to 114

## 2024-06-21 NOTE — Assessment & Plan Note (Signed)
 Under care of healthy weight clinic Lab Results  Component Value Date   HGBA1C 5.9 (H) 03/26/2024    Metformin  500 mg bid Tolerating well  Doing well with weight loss and low glycemic diet

## 2024-06-24 ENCOUNTER — Other Ambulatory Visit: Payer: Self-pay

## 2024-06-24 ENCOUNTER — Encounter: Payer: Self-pay | Admitting: Allergy and Immunology

## 2024-07-04 ENCOUNTER — Encounter (INDEPENDENT_AMBULATORY_CARE_PROVIDER_SITE_OTHER): Payer: Self-pay | Admitting: Family Medicine

## 2024-07-04 ENCOUNTER — Telehealth (INDEPENDENT_AMBULATORY_CARE_PROVIDER_SITE_OTHER): Admitting: Family Medicine

## 2024-07-04 VITALS — Ht 65.5 in

## 2024-07-04 DIAGNOSIS — R7303 Prediabetes: Secondary | ICD-10-CM

## 2024-07-04 DIAGNOSIS — K9041 Non-celiac gluten sensitivity: Secondary | ICD-10-CM

## 2024-07-04 DIAGNOSIS — E669 Obesity, unspecified: Secondary | ICD-10-CM | POA: Diagnosis not present

## 2024-07-04 DIAGNOSIS — Z6841 Body Mass Index (BMI) 40.0 and over, adult: Secondary | ICD-10-CM | POA: Diagnosis not present

## 2024-07-04 MED ORDER — METFORMIN HCL 500 MG PO TABS: 500.0000 mg | ORAL_TABLET | Freq: Two times a day (BID) | ORAL | 1 refills | Status: DC

## 2024-07-04 NOTE — Progress Notes (Signed)
 "  Office: [phone]  /  Fax: [phone]  WEIGHT SUMMARY AND BIOMETRICS  Anthropometric Measurements Height: 5' 5.5 (1.664 m) Weight: -- (VIDEO VISIT) Weight at Last Visit: 276 lb Starting Weight: 304 lb Peak Weight: 312 lb   No data recorded Other Clinical Data Fasting: N/A Labs: N/A Today's Visit #: 7 Starting Date: 03/26/24 Comments: FBRYJMU VISIT    Chief Complaint: OBESITY   Virtual Visit via A/V Note  I connected with Shelia Bowers on 07/04/2024 at  9:20 AM EST by audiovisual telehealth and verified that I am speaking with the correct person using two identifiers.  Location: Patient: home Provider: home   I discussed the limitations, risks, security and privacy concerns of performing an evaluation and management service by AV telehealth and the availability of in person appointments. I also discussed with the patient that there may be a patient responsible charge related to this service. The patient expressed understanding and agreed to proceed.    History of Present Illness Shelia Bowers is a 54 year old female with obesity and prediabetes who presents for obesity treatment and progress assessment.  She is adhering to a category three eating plan about ninety percent of the time, ensuring not to skip meals, hydrating adequately, and meeting her recommended protein intake. She feels she has lost a couple of pounds since her last visit, including a weight loss of about three pounds over the holidays.  For exercise, she walks her dog for thirty minutes daily and has started using ergonomic hand weights and a Education officer, museum. She has joined [employer] for pool exercises, which she enjoys, and plans to adjust her work schedule to attend classes.  She manages her prediabetes with diet, exercise, and metformin , taking 500 mg twice a day. She missed a dose around the holidays but did not notice any adverse effects.  She has a history of gluten  intolerance, which has been considered in her eating plan. She also mentions having had carpal tunnel release surgery in the past, which influences her choice of exercise equipment.      PHYSICAL EXAM:  Height 5' 5.5 (1.664 m). Body mass index is 44.76 kg/m.  DIAGNOSTIC DATA REVIEWED:  BMET    Component Value Date/Time   NA 140 03/26/2024 0844   K 4.1 03/26/2024 0844   CL 100 03/26/2024 0844   CO2 23 03/26/2024 0844   GLUCOSE 101 (H) 03/26/2024 0844   GLUCOSE 112 (H) 03/01/2024 0836   BUN 10 03/26/2024 0844   CREATININE 0.92 03/26/2024 0844   CALCIUM 9.2 03/26/2024 0844   GFRNONAA >60 10/20/2021 1442   Lab Results  Component Value Date   HGBA1C 5.9 (H) 03/26/2024   Lab Results  Component Value Date   INSULIN  32.2 (H) 03/26/2024   Lab Results  Component Value Date   TSH 1.90 02/20/2024   CBC    Component Value Date/Time   WBC 8.4 02/20/2024 1243   RBC 4.50 02/20/2024 1243   HGB 13.8 02/20/2024 1243   HGB 13.8 01/16/2024 1702   HCT 41.5 02/20/2024 1243   HCT 42.7 01/16/2024 1702   PLT 246.0 02/20/2024 1243   PLT 228 01/16/2024 1702   MCV 92.2 02/20/2024 1243   MCV 95 01/16/2024 1702   MCH 30.8 01/16/2024 1702   MCH 30.6 10/20/2021 1442   MCHC 33.2 02/20/2024 1243   RDW 13.8 02/20/2024 1243   RDW 13.5 01/16/2024 1702   Iron Studies No results found for: IRON, TIBC, FERRITIN,  IRONPCTSAT Lipid Panel     Component Value Date/Time   CHOL 184 05/01/2024 0745   TRIG 96.0 05/01/2024 0745   HDL 50.10 05/01/2024 0745   CHOLHDL 4 05/01/2024 0745   VLDL 19.2 05/01/2024 0745   LDLCALC 114 (H) 05/01/2024 0745   Hepatic Function Panel     Component Value Date/Time   PROT 7.2 03/26/2024 0844   ALBUMIN 4.5 03/26/2024 0844   AST 29 03/26/2024 0844   ALT 36 (H) 03/26/2024 0844   ALKPHOS 75 03/26/2024 0844   BILITOT 0.5 03/26/2024 0844      Component Value Date/Time   TSH 1.90 02/20/2024 1243   Nutritional Lab Results  Component Value Date    VD25OH 39.51 02/20/2024     Assessment and Plan Assessment & Plan Obesity She is following the category three eating plan approximately 90% of the time, exercising by walking her dog for 30 minutes daily, and has lost a couple of pounds since the last visit. She reports feeling empowered by her dietary choices and has not felt deprived. She has incorporated ergonomic hand weights and a Education officer, museum into her exercise routine and has joined [employer] for pool exercises. - Continue category three eating plan - Continue daily walking exercise - Incorporate ergonomic hand weights and Pilates reformer board into exercise routine - [employer] for pool exercises  Prediabetes Managed with diet, exercise, weight loss, and metformin  500 mg twice daily. She reports no significant issues with medication adherence, with only one missed dose during the holidays. The metformin  is considered to cover the last 20% of prediabetes management, with diet and exercise covering the remaining 80%. - Refilled metformin  500 mg twice daily - Continue diet and exercise regimen  Gluten intolerance Managed by factoring it into her eating plan. - Continue gluten-free diet as is and will continue to monitor      Patients who are on anti-obesity medications are counseled on the importance of maintaining healthy lifestyle habits, including balanced nutrition, regular physical activity, and behavioral modifications,  Medication is an adjunct to, not a replacement for, lifestyle changes and that the long-term success and weight maintenance depend on continued adherence to these strategies.   Shelia Bowers was informed of the importance of frequent follow up visits to maximize her success with intensive lifestyle modifications for her obesity and obesity related health conditions as recommended by USPSTF and CMS guidelines  Louann Penton, MD   "

## 2024-07-15 ENCOUNTER — Other Ambulatory Visit: Payer: Self-pay

## 2024-07-17 ENCOUNTER — Other Ambulatory Visit: Payer: Self-pay

## 2024-07-17 NOTE — Progress Notes (Signed)
 Specialty Pharmacy Refill Coordination Note  [name] NAYELLIE SANSEVERINO is a 54 y.o. female contacted today regarding refills of specialty medication(s) Tezepelumab -ekko (Tezspire )   Patient requested (Patient-Rptd) Delivery   Delivery date: 07/25/24   Verified address: (Patient-Rptd) [address] [place], [place] [address]   Medication will be filled on: 07/24/24

## 2024-07-24 ENCOUNTER — Other Ambulatory Visit: Payer: Self-pay

## 2024-08-01 ENCOUNTER — Encounter (INDEPENDENT_AMBULATORY_CARE_PROVIDER_SITE_OTHER): Payer: Self-pay | Admitting: Family Medicine

## 2024-08-01 ENCOUNTER — Ambulatory Visit (INDEPENDENT_AMBULATORY_CARE_PROVIDER_SITE_OTHER): Admitting: Family Medicine

## 2024-08-01 VITALS — BP 128/82 | HR 60 | Temp 97.7°F | Ht 65.5 in | Wt 268.0 lb

## 2024-08-01 DIAGNOSIS — Z6841 Body Mass Index (BMI) 40.0 and over, adult: Secondary | ICD-10-CM

## 2024-08-01 DIAGNOSIS — R7303 Prediabetes: Secondary | ICD-10-CM

## 2024-08-01 DIAGNOSIS — E7800 Pure hypercholesterolemia, unspecified: Secondary | ICD-10-CM

## 2024-08-01 DIAGNOSIS — E669 Obesity, unspecified: Secondary | ICD-10-CM | POA: Diagnosis not present

## 2024-08-01 DIAGNOSIS — I10 Essential (primary) hypertension: Secondary | ICD-10-CM

## 2024-08-01 MED ORDER — METFORMIN HCL 500 MG PO TABS: 500.0000 mg | ORAL_TABLET | Freq: Two times a day (BID) | ORAL | 1 refills | Status: AC

## 2024-08-01 NOTE — Progress Notes (Signed)
 "  Office: [phone]  /  Fax: [phone]  WEIGHT SUMMARY AND BIOMETRICS  Anthropometric Measurements Height: 5' 5.5 (1.664 m) Weight: 268 lb (121.6 kg) BMI (Calculated): 43.9 Weight at Last Visit: 276 lb Weight Lost Since Last Visit: 8 lb Weight Gained Since Last Visit: 0 Starting Weight: 304 lb Total Weight Loss (lbs): 36 lb (16.3 kg) Peak Weight: 312 lb   Body Composition  Body Fat %: 52.4 % Fat Mass (lbs): 140.6 lbs Muscle Mass (lbs): 121.4 lbs Total Body Water (lbs): 97.6 lbs Visceral Fat Rating : 17   Other Clinical Data Fasting: yes Labs: yes Today's Visit #: 8 Starting Date: 03/26/24    Chief Complaint: OBESITY    History of Present Illness Shelia Bowers is a 54 year old female with obesity, hypertension, hyperlipidemia, and prediabetes who presents for a follow-up on her obesity treatment plan and progress.  She has been adhering to a category three eating plan approximately ninety percent of the time, focusing on increasing her intake of fruits and vegetables, meeting her recommended protein intake, staying hydrated, and not skipping meals. This has resulted in a weight loss of eight pounds over the past month since her last visit.  She generally gets seven to nine hours of sleep per night and engages in thirty minutes of daily exercise, primarily by walking her dog. Due to recent inclement weather, she has been unable to walk her dog but plans to start water running at [employer].  Her initial blood pressure reading was elevated at 142/80, but a repeat measurement was 128/82. She is currently taking losartan  hydrochlorothiazide 50/12.5 mg daily and is working on diet, exercise, and weight loss to improve her blood pressure.  She reports working on diet, exercise, and weight loss. She is taking metformin  but finds it difficult to remember the second dose at dinner, although she takes the first dose in the morning with breakfast. Her last labs were  in September, and she is due for a recheck today, including A1c, insulin , vitamin D , and B12 levels.  She lives with her son, daughter-in-law, and granddaughter, who are saving for a house. She enjoys cruising once a year and has a trip planned to [place] in [place].      PHYSICAL EXAM:  Blood pressure 128/82, pulse 60, temperature 97.7 F (36.5 C), height 5' 5.5 (1.664 m), weight 268 lb (121.6 kg), SpO2 96%. Body mass index is 43.92 kg/m.  DIAGNOSTIC DATA REVIEWED BY MYSELF TODAY:  BMET    Component Value Date/Time   NA 140 03/26/2024 0844   K 4.1 03/26/2024 0844   CL 100 03/26/2024 0844   CO2 23 03/26/2024 0844   GLUCOSE 101 (H) 03/26/2024 0844   GLUCOSE 112 (H) 03/01/2024 0836   BUN 10 03/26/2024 0844   CREATININE 0.92 03/26/2024 0844   CALCIUM 9.2 03/26/2024 0844   GFRNONAA >60 10/20/2021 1442   Lab Results  Component Value Date   HGBA1C 5.9 (H) 03/26/2024   Lab Results  Component Value Date   INSULIN  32.2 (H) 03/26/2024   Lab Results  Component Value Date   TSH 1.90 02/20/2024   CBC    Component Value Date/Time   WBC 8.4 02/20/2024 1243   RBC 4.50 02/20/2024 1243   HGB 13.8 02/20/2024 1243   HGB 13.8 01/16/2024 1702   HCT 41.5 02/20/2024 1243   HCT 42.7 01/16/2024 1702   PLT 246.0 02/20/2024 1243   PLT 228 01/16/2024 1702   MCV 92.2 02/20/2024 1243  MCV 95 01/16/2024 1702   MCH 30.8 01/16/2024 1702   MCH 30.6 10/20/2021 1442   MCHC 33.2 02/20/2024 1243   RDW 13.8 02/20/2024 1243   RDW 13.5 01/16/2024 1702   Iron Studies No results found for: IRON, TIBC, FERRITIN, IRONPCTSAT Lipid Panel     Component Value Date/Time   CHOL 184 05/01/2024 0745   TRIG 96.0 05/01/2024 0745   HDL 50.10 05/01/2024 0745   CHOLHDL 4 05/01/2024 0745   VLDL 19.2 05/01/2024 0745   LDLCALC 114 (H) 05/01/2024 0745   Hepatic Function Panel     Component Value Date/Time   PROT 7.2 03/26/2024 0844   ALBUMIN 4.5 03/26/2024 0844   AST 29 03/26/2024 0844    ALT 36 (H) 03/26/2024 0844   ALKPHOS 75 03/26/2024 0844   BILITOT 0.5 03/26/2024 0844      Component Value Date/Time   TSH 1.90 02/20/2024 1243   Nutritional Lab Results  Component Value Date   VD25OH 39.51 02/20/2024     Assessment and Plan Assessment & Plan Obesity Management is ongoing with a focus on dietary changes and exercise. She adheres to the category three eating plan 90% of the time, incorporating more fruits and vegetables, meeting protein recommendations, and maintaining hydration. She exercises 30 minutes daily, primarily walking her dog. She has lost 8 pounds in the last month. Challenges with meal planning and preparation were discussed, and new meal prep ideas were provided to maintain interest and nutritional balance. - Continue category three eating plan - Encouraged meal planning and preparation with provided meal ideas - Continue exercise regimen, including walking and water exercises at [employer]  Prediabetes Management includes dietary modifications, exercise, and metformin . She reports difficulty remembering the second dose of metformin , which is taken with dinner. Labs for A1c, insulin , kidney, liver function, vitamin D , and B12 are due to assess metabolic control and nutritional status. - Continue metformin  500 mg oral bid - Ordered labs for A1c, insulin , kidney, liver function, vitamin D , and B12 - Encouraged setting reminders for medication adherence - Continue diet, exercise and weight loss as discussed today as an important part of the treatment plan   Essential hypertension Managed with losartan  hydrochlorothiazide 50/12.5 mg daily. Initial blood pressure was elevated at 142/80, but normalized to 128/82 on repeat measurement. She is working on diet, exercise, and weight loss to improve blood pressure control. - Continue losartan  hydrochlorothiazide 50/12.5 mg daily - Continue diet, exercise and weight loss as discussed today as an important part of the  treatment plan   Hypercholesterolemia Managed with diet, exercise, and weight loss. Labs are planned to assess lipid levels. - Ordered labs to assess lipid levels - Continue diet, exercise and weight loss as discussed today as an important part of the treatment plan       Patients who are on anti-obesity medications are counseled on the importance of maintaining healthy lifestyle habits, including balanced nutrition, regular physical activity, and behavioral modifications,  Medication is an adjunct to, not a replacement for, lifestyle changes and that the long-term success and weight maintenance depend on continued adherence to these strategies.   Vona was informed of the importance of frequent follow up visits to maximize her success with intensive lifestyle modifications for her obesity and obesity related health conditions as recommended by USPSTF and CMS guidelines  Louann Penton, MD   "

## 2024-08-02 LAB — LIPID PANEL WITH LDL/HDL RATIO
Cholesterol, Total: 190 mg/dL (ref 100–199)
HDL: 49 mg/dL
LDL Chol Calc (NIH): 119 mg/dL — ABNORMAL HIGH (ref 0–99)
LDL/HDL Ratio: 2.4 ratio (ref 0.0–3.2)
Triglycerides: 121 mg/dL (ref 0–149)
VLDL Cholesterol Cal: 22 mg/dL (ref 5–40)

## 2024-08-02 LAB — CMP14+EGFR
ALT: 12 [IU]/L (ref 0–32)
AST: 14 [IU]/L (ref 0–40)
Albumin: 4.1 g/dL (ref 3.8–4.9)
Alkaline Phosphatase: 60 [IU]/L (ref 49–135)
BUN/Creatinine Ratio: 25 — ABNORMAL HIGH (ref 9–23)
BUN: 15 mg/dL (ref 6–24)
Bilirubin Total: 0.4 mg/dL (ref 0.0–1.2)
CO2: 21 mmol/L (ref 20–29)
Calcium: 8.9 mg/dL (ref 8.7–10.2)
Chloride: 104 mmol/L (ref 96–106)
Creatinine, Ser: 0.59 mg/dL (ref 0.57–1.00)
Globulin, Total: 2.4 g/dL (ref 1.5–4.5)
Glucose: 85 mg/dL (ref 70–99)
Potassium: 3.9 mmol/L (ref 3.5–5.2)
Sodium: 139 mmol/L (ref 134–144)
Total Protein: 6.5 g/dL (ref 6.0–8.5)
eGFR: 107 mL/min/{1.73_m2}

## 2024-08-02 LAB — INSULIN, RANDOM: INSULIN: 15.5 u[IU]/mL (ref 2.6–24.9)

## 2024-08-02 LAB — HEMOGLOBIN A1C
Est. average glucose Bld gHb Est-mCnc: 114 mg/dL
Hgb A1c MFr Bld: 5.6 % (ref 4.8–5.6)

## 2024-08-02 LAB — VITAMIN D 25 HYDROXY (VIT D DEFICIENCY, FRACTURES): Vit D, 25-Hydroxy: 42.2 ng/mL (ref 30.0–100.0)

## 2024-08-02 LAB — VITAMIN B12: Vitamin B-12: 439 pg/mL (ref 232–1245)

## 2024-08-15 ENCOUNTER — Ambulatory Visit: Admitting: Nurse Practitioner

## 2024-08-19 ENCOUNTER — Ambulatory Visit (INDEPENDENT_AMBULATORY_CARE_PROVIDER_SITE_OTHER): Admitting: Family Medicine

## 2024-09-11 ENCOUNTER — Ambulatory Visit: Admitting: Family Medicine

## 2024-09-16 ENCOUNTER — Ambulatory Visit (INDEPENDENT_AMBULATORY_CARE_PROVIDER_SITE_OTHER): Admitting: Family Medicine

## 2024-12-17 ENCOUNTER — Ambulatory Visit: Admitting: Allergy and Immunology
